# Patient Record
Sex: Female | Born: 1935 | Race: Black or African American | Hispanic: No | Marital: Married
Health system: Southern US, Community
[De-identification: ages and names within clinical notes are randomized; demographics above are authoritative.]

## PROBLEM LIST (undated history)

## (undated) DIAGNOSIS — C50919 Malignant neoplasm of unspecified site of unspecified female breast: Secondary | ICD-10-CM

## (undated) DIAGNOSIS — F0390 Unspecified dementia without behavioral disturbance: Secondary | ICD-10-CM

## (undated) DIAGNOSIS — E119 Type 2 diabetes mellitus without complications: Secondary | ICD-10-CM

---

## 2006-01-11 ENCOUNTER — Ambulatory Visit: Payer: Self-pay | Admitting: Family Medicine

## 2006-05-02 ENCOUNTER — Ambulatory Visit: Payer: Self-pay | Admitting: Otolaryngology

## 2006-06-07 ENCOUNTER — Ambulatory Visit: Payer: Self-pay | Admitting: Otolaryngology

## 2006-06-07 ENCOUNTER — Other Ambulatory Visit: Payer: Self-pay

## 2006-06-15 ENCOUNTER — Ambulatory Visit: Payer: Self-pay | Admitting: Otolaryngology

## 2009-11-13 ENCOUNTER — Ambulatory Visit: Payer: Self-pay | Admitting: Family Medicine

## 2015-05-23 ENCOUNTER — Other Ambulatory Visit: Payer: Self-pay | Admitting: Internal Medicine

## 2015-05-23 DIAGNOSIS — Z1231 Encounter for screening mammogram for malignant neoplasm of breast: Secondary | ICD-10-CM

## 2015-07-01 ENCOUNTER — Ambulatory Visit
Admission: RE | Admit: 2015-07-01 | Discharge: 2015-07-01 | Disposition: A | Discharge status: Home / Self Care | Payer: Medicare Other | Source: Ambulatory Visit | Attending: Internal Medicine | Admitting: Internal Medicine

## 2015-07-01 ENCOUNTER — Ambulatory Visit: Payer: Self-pay

## 2015-07-01 DIAGNOSIS — Z1231 Encounter for screening mammogram for malignant neoplasm of breast: Secondary | ICD-10-CM | POA: Diagnosis present

## 2015-07-01 HISTORY — DX: Malignant neoplasm of unspecified site of unspecified female breast: C50.919

## 2017-10-28 ENCOUNTER — Other Ambulatory Visit: Payer: Self-pay | Admitting: Internal Medicine

## 2017-10-28 DIAGNOSIS — Z780 Asymptomatic menopausal state: Secondary | ICD-10-CM

## 2017-11-17 ENCOUNTER — Ambulatory Visit
Admission: RE | Admit: 2017-11-17 | Discharge: 2017-11-17 | Disposition: A | Discharge status: Home / Self Care | Payer: Medicare Other | Source: Ambulatory Visit | Attending: Internal Medicine | Admitting: Internal Medicine

## 2017-11-17 DIAGNOSIS — Z780 Asymptomatic menopausal state: Secondary | ICD-10-CM

## 2020-01-27 ENCOUNTER — Other Ambulatory Visit: Payer: Self-pay

## 2020-01-27 ENCOUNTER — Emergency Department
Admission: EM | Admit: 2020-01-27 | Discharge: 2020-01-28 | Disposition: A | Discharge status: Home / Self Care | Payer: Medicare PPO | Attending: Emergency Medicine | Admitting: Emergency Medicine

## 2020-01-27 ENCOUNTER — Emergency Department: Payer: Medicare PPO

## 2020-01-27 ENCOUNTER — Encounter: Payer: Self-pay | Admitting: *Deleted

## 2020-01-27 DIAGNOSIS — R5383 Other fatigue: Secondary | ICD-10-CM | POA: Diagnosis present

## 2020-01-27 DIAGNOSIS — R464 Slowness and poor responsiveness: Secondary | ICD-10-CM | POA: Insufficient documentation

## 2020-01-27 DIAGNOSIS — Z853 Personal history of malignant neoplasm of breast: Secondary | ICD-10-CM | POA: Insufficient documentation

## 2020-01-27 DIAGNOSIS — R4182 Altered mental status, unspecified: Secondary | ICD-10-CM | POA: Insufficient documentation

## 2020-01-27 DIAGNOSIS — R4189 Other symptoms and signs involving cognitive functions and awareness: Secondary | ICD-10-CM

## 2020-01-27 DIAGNOSIS — N390 Urinary tract infection, site not specified: Secondary | ICD-10-CM

## 2020-01-27 LAB — CBC
HCT: 36.7 % (ref 36.0–46.0)
Hemoglobin: 11.6 g/dL — ABNORMAL LOW (ref 12.0–15.0)
MCH: 25.2 pg — ABNORMAL LOW (ref 26.0–34.0)
MCHC: 31.6 g/dL (ref 30.0–36.0)
MCV: 79.8 fL — ABNORMAL LOW (ref 80.0–100.0)
Platelets: 294 10*3/uL (ref 150–400)
RBC: 4.6 MIL/uL (ref 3.87–5.11)
RDW: 15 % (ref 11.5–15.5)
WBC: 7.1 10*3/uL (ref 4.0–10.5)
nRBC: 0 % (ref 0.0–0.2)

## 2020-01-27 LAB — BASIC METABOLIC PANEL
Anion gap: 10 (ref 5–15)
BUN: 9 mg/dL (ref 8–23)
CO2: 27 mmol/L (ref 22–32)
Calcium: 9.1 mg/dL (ref 8.9–10.3)
Chloride: 102 mmol/L (ref 98–111)
Creatinine, Ser: 0.82 mg/dL (ref 0.44–1.00)
GFR calc Af Amer: >60 mL/min (ref >60)
GFR calc non Af Amer: >60 mL/min (ref >60)
Glucose, Bld: 218 mg/dL — ABNORMAL HIGH (ref 70–99)
Potassium: 3.6 mmol/L (ref 3.5–5.1)
Sodium: 139 mmol/L (ref 135–145)

## 2020-01-27 LAB — URINALYSIS, COMPLETE (UACMP) WITH MICROSCOPIC
Bilirubin Urine: NEGATIVE
Glucose, UA: NEGATIVE mg/dL
Hgb urine dipstick: NEGATIVE
Ketones, ur: NEGATIVE mg/dL
Nitrite: POSITIVE — AB
Protein, ur: NEGATIVE mg/dL
Specific Gravity, Urine: 1.004 — ABNORMAL LOW (ref 1.005–1.030)
pH: 8 (ref 5.0–8.0)

## 2020-01-27 LAB — TROPONIN I (HIGH SENSITIVITY): Troponin I (High Sensitivity): 5 ng/L (ref <18)

## 2020-01-27 MED ORDER — CEPHALEXIN 500 MG PO CAPS: 500.0000 mg | ORAL_CAPSULE | Freq: Four times a day (QID) | ORAL | 0 refills | Status: AC

## 2020-01-27 MED ORDER — SODIUM CHLORIDE 0.9% FLUSH: 3.0000 mL | Freq: Once | INTRAVENOUS | Status: DC

## 2020-01-27 MED ORDER — SODIUM CHLORIDE 0.9 % IV SOLN
1.0000 g | Freq: Once | INTRAVENOUS | Status: AC
  Administered 2020-01-27: 1 g via INTRAVENOUS
  Filled 2020-01-27: qty 10

## 2020-01-27 NOTE — ED Notes (Signed)
Attempted to I&O cath pt earlier and she became alert (earlier she was restful and not verbal with me) and pt became upset and kicked and swung at nursing staff.  Reoriented pt and placed on purewick.  Gave pt orange juice.  Pt appeared to rest.  Pt then came walking out of her room, steady gait but voiding while walking.  Was able to get pt to come back to room where she voided in hat and then sat on bed and gave pt sandwich meal.  No distress at this time.

## 2020-01-27 NOTE — ED Triage Notes (Signed)
Pt arrives from [facility] by ems for lethargy, being less resposive and CP. At baseline pt is very talkative per ems report. Pt was hypertensive for ems (198/90).

## 2020-01-27 NOTE — ED Provider Notes (Signed)
[facility] Emergency Department Provider Note  ____________________________________________   I have reviewed the triage vital signs and the nursing notes.   HISTORY  Chief Complaint Fatigue   History limited by and level 5 caveat due to: AMS/non verbal   HPI Carol Curtis is a 84 y.o. female who presents to the emergency department today brought by EMS from living facility because of concerns for lethargy and decreased mental status.  Patient herself cannot give any history.  She is altered and essentially nonverbal during my exam.   Records reviewed. Per medical record review patient has a history of breast cancer, dementia.   Past Medical History:  Diagnosis Date  . Breast cancer ([place])    left breast ca    There are no problems to display for this patient.   History reviewed. No pertinent surgical history.  Prior to Admission medications   Not on File    Allergies Patient has no known allergies.  No family history on file.  Social History Social History   Tobacco Use  . Smoking status: Not on file  Substance Use Topics  . Alcohol use: Never  . Drug use: Never    Review of Systems Unable to obtain reliable ROS secondary to AMS ____________________________________________   PHYSICAL EXAM:  VITAL SIGNS: ED Triage Vitals [01/27/20 2027]  Enc Vitals Group     BP (!) 190/74     Pulse Rate 62     Resp 14     Temp 97.6 F (36.4 C)     Temp Source Oral     SpO2 99 %   Constitutional: Awake and alert.  Eyes: Conjunctivae are normal.  ENT      Head: Normocephalic and atraumatic.      Nose: No congestion/rhinnorhea.      Mouth/Throat: Mucous membranes are moist.      Neck: No stridor. Hematological/Lymphatic/Immunilogical: No cervical lymphadenopathy. Cardiovascular: Normal rate, regular rhythm.  No murmurs, rubs, or gallops.  Respiratory: Normal respiratory effort without tachypnea nor retractions. Breath sounds are  clear and equal bilaterally. No wheezes/rales/rhonchi. Gastrointestinal: Soft and non tender. No rebound. No guarding.  Genitourinary: Deferred Musculoskeletal: Normal range of motion in all extremities. No lower extremity edema. Neurologic:  Awake and alert. Non verbal. Appears to be moving all extremities.  Skin:  Skin is warm, dry and intact. No rash noted.  ____________________________________________    LABS (pertinent positives/negatives)  CBC wbc 7.1, hgb 11.6, plt 294 BMP wnl except glu 218  ____________________________________________   EKG  I, Nance Pear, attending physician, personally viewed and interpreted this EKG  EKG Time: 2038 Rate: 63 Rhythm: sinus rhythm Axis: normal Intervals: qtc 425 QRS: narrow, q waves v1 ST changes: no st elevation Impression: abnormal ekg  ____________________________________________    RADIOLOGY  CT head No acute abnormality   ____________________________________________   PROCEDURES  Procedures  ____________________________________________   INITIAL IMPRESSION / ASSESSMENT AND PLAN / ED COURSE  Pertinent labs & imaging results that were available during my care of the patient were reviewed by me and considered in my medical decision making (see chart for details).   Patient presented to the emergency department today because of concerns for decreased responsiveness.  On initial exam patient was essentially nonverbal.  Differential would be broad including intracranial process infection, anemia, electrolyte abnormality amongst other etiologies.  Head CT was negative.  Blood work without concerning anemia or electrolyte abnormality.  While patient was here in the emergency department she did become  much more awake and alert was able to ambulate easily around the room without assistance.  She became more verbal.  She still cannot give good history.  Awaiting urine and troponin at time of  signout.  ____________________________________________   FINAL CLINICAL IMPRESSION(S) / ED DIAGNOSES  Final diagnoses:  Decreased responsiveness     Note: This dictation was prepared with Dragon dictation. Any transcriptional errors that result from this process are unintentional     Nance Pear, MD 01/27/20 2315

## 2020-01-27 NOTE — Discharge Instructions (Signed)
Please seek medical attention for any high fevers, chest pain, shortness of breath, change in behavior, persistent vomiting, bloody stool or any other new or concerning symptoms.  

## 2020-01-27 NOTE — ED Notes (Signed)
Pt is alert, continues to walk out to the room, redirected and given some dessert to occupy her.  No distress.  Pt is eating

## 2020-01-28 LAB — TROPONIN I (HIGH SENSITIVITY): Troponin I (High Sensitivity): 6 ng/L (ref <18)

## 2020-01-28 NOTE — ED Notes (Signed)
Pt was discharged to [facility]

## 2020-01-28 NOTE — ED Notes (Signed)
Pt walked around nurses station with me, no distress.  Helped her back on stretcher and covered her with blankets, lights dimmed so pt can rest.

## 2020-01-28 NOTE — ED Notes (Signed)
Report given to [facility], spoke with [facility].

## 2020-01-28 NOTE — ED Notes (Signed)
Await ambulance transport back

## 2020-01-29 LAB — URINE CULTURE: Culture: >=100000 — AB

## 2020-01-31 NOTE — Progress Notes (Signed)
Brief Pharmacy Note  ED Culture reviewed. Culture with > 100,000 colonies ESBL E.coli. Patient was discharged on Keflex. Discussed with EDP Archie Balboa. Will switch to Macrobid. Call to [facility] to discuss with RN. Information relayed regarding Macrobid and stopping Keflex. New prescription called in to [place] per request of staff at [facility].  Dorena Bodo, PharmD

## 2020-03-20 ENCOUNTER — Emergency Department: Payer: Medicare PPO

## 2020-03-20 ENCOUNTER — Inpatient Hospital Stay
Admission: EM | Admit: 2020-03-20 | Discharge: 2020-04-01 | DRG: 689 | Disposition: A | Discharge status: Skilled Nursing Facility | Payer: Medicare PPO | Source: Skilled Nursing Facility | Attending: Internal Medicine | Admitting: Internal Medicine

## 2020-03-20 ENCOUNTER — Other Ambulatory Visit: Payer: Self-pay

## 2020-03-20 DIAGNOSIS — Z1612 Extended spectrum beta lactamase (ESBL) resistance: Secondary | ICD-10-CM | POA: Diagnosis present

## 2020-03-20 DIAGNOSIS — G9341 Metabolic encephalopathy: Secondary | ICD-10-CM | POA: Diagnosis present

## 2020-03-20 DIAGNOSIS — Z79899 Other long term (current) drug therapy: Secondary | ICD-10-CM | POA: Diagnosis not present

## 2020-03-20 DIAGNOSIS — M461 Sacroiliitis, not elsewhere classified: Secondary | ICD-10-CM | POA: Diagnosis present

## 2020-03-20 DIAGNOSIS — B9629 Other Escherichia coli [E. coli] as the cause of diseases classified elsewhere: Secondary | ICD-10-CM | POA: Diagnosis not present

## 2020-03-20 DIAGNOSIS — R338 Other retention of urine: Secondary | ICD-10-CM | POA: Diagnosis not present

## 2020-03-20 DIAGNOSIS — M7989 Other specified soft tissue disorders: Secondary | ICD-10-CM

## 2020-03-20 DIAGNOSIS — F0391 Unspecified dementia with behavioral disturbance: Secondary | ICD-10-CM | POA: Diagnosis present

## 2020-03-20 DIAGNOSIS — Z95828 Presence of other vascular implants and grafts: Secondary | ICD-10-CM | POA: Diagnosis not present

## 2020-03-20 DIAGNOSIS — Z8619 Personal history of other infectious and parasitic diseases: Secondary | ICD-10-CM | POA: Diagnosis not present

## 2020-03-20 DIAGNOSIS — E1169 Type 2 diabetes mellitus with other specified complication: Secondary | ICD-10-CM | POA: Diagnosis present

## 2020-03-20 DIAGNOSIS — B9620 Unspecified Escherichia coli [E. coli] as the cause of diseases classified elsewhere: Secondary | ICD-10-CM | POA: Diagnosis present

## 2020-03-20 DIAGNOSIS — E1165 Type 2 diabetes mellitus with hyperglycemia: Secondary | ICD-10-CM | POA: Diagnosis present

## 2020-03-20 DIAGNOSIS — R4182 Altered mental status, unspecified: Secondary | ICD-10-CM

## 2020-03-20 DIAGNOSIS — Z1611 Resistance to penicillins: Secondary | ICD-10-CM | POA: Diagnosis present

## 2020-03-20 DIAGNOSIS — Z7189 Other specified counseling: Secondary | ICD-10-CM | POA: Diagnosis not present

## 2020-03-20 DIAGNOSIS — F0280 Dementia in other diseases classified elsewhere without behavioral disturbance: Secondary | ICD-10-CM | POA: Diagnosis not present

## 2020-03-20 DIAGNOSIS — Z794 Long term (current) use of insulin: Secondary | ICD-10-CM

## 2020-03-20 DIAGNOSIS — E785 Hyperlipidemia, unspecified: Secondary | ICD-10-CM | POA: Diagnosis present

## 2020-03-20 DIAGNOSIS — Z853 Personal history of malignant neoplasm of breast: Secondary | ICD-10-CM

## 2020-03-20 DIAGNOSIS — Z7984 Long term (current) use of oral hypoglycemic drugs: Secondary | ICD-10-CM | POA: Diagnosis not present

## 2020-03-20 DIAGNOSIS — G309 Alzheimer's disease, unspecified: Secondary | ICD-10-CM | POA: Diagnosis not present

## 2020-03-20 DIAGNOSIS — W06XXXA Fall from bed, initial encounter: Secondary | ICD-10-CM | POA: Diagnosis present

## 2020-03-20 DIAGNOSIS — R531 Weakness: Secondary | ICD-10-CM | POA: Diagnosis present

## 2020-03-20 DIAGNOSIS — Z20822 Contact with and (suspected) exposure to covid-19: Secondary | ICD-10-CM | POA: Diagnosis present

## 2020-03-20 DIAGNOSIS — Z515 Encounter for palliative care: Secondary | ICD-10-CM | POA: Diagnosis not present

## 2020-03-20 DIAGNOSIS — N3000 Acute cystitis without hematuria: Secondary | ICD-10-CM | POA: Diagnosis present

## 2020-03-20 DIAGNOSIS — K5900 Constipation, unspecified: Secondary | ICD-10-CM

## 2020-03-20 DIAGNOSIS — R52 Pain, unspecified: Secondary | ICD-10-CM

## 2020-03-20 DIAGNOSIS — F0390 Unspecified dementia without behavioral disturbance: Secondary | ICD-10-CM | POA: Diagnosis present

## 2020-03-20 DIAGNOSIS — Z90710 Acquired absence of both cervix and uterus: Secondary | ICD-10-CM | POA: Diagnosis not present

## 2020-03-20 DIAGNOSIS — W19XXXA Unspecified fall, initial encounter: Secondary | ICD-10-CM | POA: Diagnosis present

## 2020-03-20 DIAGNOSIS — N390 Urinary tract infection, site not specified: Secondary | ICD-10-CM

## 2020-03-20 DIAGNOSIS — F05 Delirium due to known physiological condition: Secondary | ICD-10-CM | POA: Diagnosis present

## 2020-03-20 DIAGNOSIS — I82433 Acute embolism and thrombosis of popliteal vein, bilateral: Secondary | ICD-10-CM | POA: Diagnosis present

## 2020-03-20 DIAGNOSIS — Z7982 Long term (current) use of aspirin: Secondary | ICD-10-CM

## 2020-03-20 DIAGNOSIS — I493 Ventricular premature depolarization: Secondary | ICD-10-CM | POA: Diagnosis present

## 2020-03-20 HISTORY — DX: Type 2 diabetes mellitus without complications: E11.9

## 2020-03-20 HISTORY — DX: Unspecified dementia, unspecified severity, without behavioral disturbance, psychotic disturbance, mood disturbance, and anxiety: F03.90

## 2020-03-20 LAB — CBC WITH DIFFERENTIAL/PLATELET
Abs Immature Granulocytes: 0.04 10*3/uL (ref 0.00–0.07)
Basophils Absolute: 0 10*3/uL (ref 0.0–0.1)
Basophils Relative: 0 %
Eosinophils Absolute: 0.1 10*3/uL (ref 0.0–0.5)
Eosinophils Relative: 1 %
HCT: 31.9 % — ABNORMAL LOW (ref 36.0–46.0)
Hemoglobin: 10.8 g/dL — ABNORMAL LOW (ref 12.0–15.0)
Immature Granulocytes: 0 %
Lymphocytes Relative: 10 %
Lymphs Abs: 1.1 10*3/uL (ref 0.7–4.0)
MCH: 25.8 pg — ABNORMAL LOW (ref 26.0–34.0)
MCHC: 33.9 g/dL (ref 30.0–36.0)
MCV: 76.1 fL — ABNORMAL LOW (ref 80.0–100.0)
Monocytes Absolute: 0.9 10*3/uL (ref 0.1–1.0)
Monocytes Relative: 8 %
Neutro Abs: 9 10*3/uL — ABNORMAL HIGH (ref 1.7–7.7)
Neutrophils Relative %: 81 %
Platelets: 267 10*3/uL (ref 150–400)
RBC: 4.19 MIL/uL (ref 3.87–5.11)
RDW: 14.2 % (ref 11.5–15.5)
WBC: 11.1 10*3/uL — ABNORMAL HIGH (ref 4.0–10.5)
nRBC: 0 % (ref 0.0–0.2)

## 2020-03-20 LAB — RESPIRATORY PANEL BY RT PCR (FLU A&B, COVID)
Influenza A by PCR: NEGATIVE
Influenza B by PCR: NEGATIVE
SARS Coronavirus 2 by RT PCR: NEGATIVE

## 2020-03-20 LAB — HEMOGLOBIN A1C
Hgb A1c MFr Bld: 8.8 % — ABNORMAL HIGH (ref 4.8–5.6)
Mean Plasma Glucose: 205.86 mg/dL

## 2020-03-20 LAB — GLUCOSE, CAPILLARY
Glucose-Capillary: 202 mg/dL — ABNORMAL HIGH (ref 70–99)
Glucose-Capillary: 169 mg/dL — ABNORMAL HIGH (ref 70–99)
Glucose-Capillary: 143 mg/dL — ABNORMAL HIGH (ref 70–99)

## 2020-03-20 LAB — BASIC METABOLIC PANEL
Anion gap: 13 (ref 5–15)
BUN: 21 mg/dL (ref 8–23)
CO2: 26 mmol/L (ref 22–32)
Calcium: 9.3 mg/dL (ref 8.9–10.3)
Chloride: 94 mmol/L — ABNORMAL LOW (ref 98–111)
Creatinine, Ser: 1 mg/dL (ref 0.44–1.00)
GFR calc Af Amer: >60 mL/min (ref >60)
GFR calc non Af Amer: 52 mL/min — ABNORMAL LOW (ref >60)
Glucose, Bld: 372 mg/dL — ABNORMAL HIGH (ref 70–99)
Potassium: 4.4 mmol/L (ref 3.5–5.1)
Sodium: 133 mmol/L — ABNORMAL LOW (ref 135–145)

## 2020-03-20 LAB — URINALYSIS, COMPLETE (UACMP) WITH MICROSCOPIC
Bilirubin Urine: NEGATIVE
Glucose, UA: >=500 mg/dL — AB
Hgb urine dipstick: NEGATIVE
Ketones, ur: NEGATIVE mg/dL
Nitrite: NEGATIVE
Protein, ur: 100 mg/dL — AB
Specific Gravity, Urine: 1.026 (ref 1.005–1.030)
pH: 5 (ref 5.0–8.0)

## 2020-03-20 LAB — BETA-HYDROXYBUTYRIC ACID: Beta-Hydroxybutyric Acid: 0.54 mmol/L — ABNORMAL HIGH (ref 0.05–0.27)

## 2020-03-20 MED ORDER — ENOXAPARIN SODIUM 40 MG/0.4ML ~~LOC~~ SOLN
40.0000 mg | SUBCUTANEOUS | Status: DC
  Administered 2020-03-20 – 2020-03-21 (×2): 40 mg via SUBCUTANEOUS
  Filled 2020-03-20 (×2): qty 0.4

## 2020-03-20 MED ORDER — INSULIN ASPART 100 UNIT/ML ~~LOC~~ SOLN
0.0000 [IU] | SUBCUTANEOUS | Status: DC
  Administered 2020-03-20: 3 [IU] via SUBCUTANEOUS
  Administered 2020-03-20: 2 [IU] via SUBCUTANEOUS
  Administered 2020-03-20: 5 [IU] via SUBCUTANEOUS
  Administered 2020-03-21: 8 [IU] via SUBCUTANEOUS
  Administered 2020-03-21 (×3): 2 [IU] via SUBCUTANEOUS
  Administered 2020-03-21: 3 [IU] via SUBCUTANEOUS
  Administered 2020-03-22: 8 [IU] via SUBCUTANEOUS
  Administered 2020-03-22: 3 [IU] via SUBCUTANEOUS
  Administered 2020-03-22: 8 [IU] via SUBCUTANEOUS
  Administered 2020-03-22 – 2020-03-23 (×4): 3 [IU] via SUBCUTANEOUS
  Administered 2020-03-23: 8 [IU] via SUBCUTANEOUS
  Filled 2020-03-20 (×16): qty 1

## 2020-03-20 MED ORDER — SODIUM CHLORIDE 0.9 % IV SOLN: INTRAVENOUS | Status: DC

## 2020-03-20 MED ORDER — SODIUM CHLORIDE 0.9 % IV SOLN
1.0000 g | Freq: Once | INTRAVENOUS | Status: AC
  Administered 2020-03-20: 1 g via INTRAVENOUS
  Filled 2020-03-20: qty 10

## 2020-03-20 MED ORDER — ONDANSETRON HCL 4 MG PO TABS: 4.0000 mg | ORAL_TABLET | Freq: Four times a day (QID) | ORAL | Status: DC | PRN

## 2020-03-20 MED ORDER — INSULIN ASPART 100 UNIT/ML ~~LOC~~ SOLN
6.0000 [IU] | Freq: Once | SUBCUTANEOUS | Status: AC
  Administered 2020-03-20: 6 [IU] via INTRAVENOUS
  Filled 2020-03-20: qty 1

## 2020-03-20 MED ORDER — SODIUM CHLORIDE 0.9 % IV BOLUS
500.0000 mL | Freq: Once | INTRAVENOUS | Status: AC
  Administered 2020-03-20: 500 mL via INTRAVENOUS

## 2020-03-20 MED ORDER — ONDANSETRON HCL 4 MG/2ML IJ SOLN: 4.0000 mg | Freq: Four times a day (QID) | INTRAMUSCULAR | Status: DC | PRN

## 2020-03-20 MED ORDER — SODIUM CHLORIDE 0.9 % IV SOLN
1.0000 g | Freq: Two times a day (BID) | INTRAVENOUS | Status: DC
  Administered 2020-03-20 – 2020-03-21 (×3): 1 g via INTRAVENOUS
  Filled 2020-03-20 (×5): qty 1

## 2020-03-20 NOTE — Consult Note (Signed)
Pharmacy Antibiotic Note  Carol Curtis is a 84 y.o. female admitted on 03/20/2020 with UTI.  Pharmacy has been consulted for Meropenem dosing. Patient has a history of E.Coli (ESBL) urinary infection. PMH significant for dementia and diabetes mellitus. Patient had an unwitnessed fall and blood glucose on presentatio was 411. Patient was noted to be confused and weak, per notes.  Plan: Will order Meropenem 1g Q12H.   Height: 5\' 4"  (162.6 cm) Weight: 72.6 kg (160 lb) IBW/kg (Calculated) : 54.7  Temp (24hrs), Avg:98.7 F (37.1 C), Min:98.7 F (37.1 C), Max:98.7 F (37.1 C)  Recent Labs  Lab 03/20/20 0633  WBC 11.1*  CREATININE 1.00    Estimated Creatinine Clearance: 42.4 mL/min (by C-G formula based on SCr of 1 mg/dL).    No Known Allergies  Antimicrobials this admission: 9/30 Meropenem >>  Dose adjustments this admission:   Microbiology results: BCx:  UCx:  Sputum:  MRSA PCR:   Thank you for allowing pharmacy to be a part of this patient's care.  Rowland Lathe 03/20/2020 12:57 PM

## 2020-03-20 NOTE — ED Provider Notes (Signed)
[facility] Emergency Department Provider Note    First MD Initiated Contact with Patient 03/20/20 0700     (approximate)  I have reviewed the triage vital signs and the nursing notes.   HISTORY  Chief Complaint Fall  Level V Caveat:  AMS - dementia  HPI Carol Curtis is a 84 y.o. female the below listed past medical history presents to the ER altered mental status after fall out of bed.  Patient not complaining of any pain.  Very poor historian.  No further details provided.  Noted to be hyperglycemic on arrival.    Past Medical History:  Diagnosis Date  . Breast cancer ([place])    left breast ca  . Dementia ([place])   . Diabetes mellitus without complication ([place])    History reviewed. No pertinent family history. History reviewed. No pertinent surgical history. Patient Active Problem List   Diagnosis Date Noted  . Acute metabolic encephalopathy 31/51/7616  . Dementia [facility])       Prior to Admission medications   Not on File    Allergies Patient has no known allergies.    Social History Social History   Tobacco Use  . Smoking status: Unknown If Ever Smoked  Substance Use Topics  . Alcohol use: Never  . Drug use: Never    Review of Systems Patient denies headaches, rhinorrhea, blurry vision, numbness, shortness of breath, chest pain, edema, cough, abdominal pain, nausea, vomiting, diarrhea, dysuria, fevers, rashes or hallucinations unless otherwise stated above in HPI. ____________________________________________   PHYSICAL EXAM:  VITAL SIGNS: Vitals:   03/20/20 0700 03/20/20 0837  BP: 122/63 130/68  Pulse: 94   Resp: (!) 21 17  Temp:    SpO2: 97%     Constitutional: Alert and disoriented.  Eyes: Conjunctivae are normal.  Head: Atraumatic. Nose: No congestion/rhinnorhea. Mouth/Throat: Mucous membranes are moist.   Neck: No stridor. Painless ROM.  Cardiovascular: Normal rate, regular rhythm. Grossly normal heart  sounds.  Good peripheral circulation. Respiratory: Normal respiratory effort.  No retractions. Lungs CTAB. Gastrointestinal: Soft and nontender. No distention. No abdominal bruits. No CVA tenderness. Genitourinary:  Musculoskeletal: pain of right hip with log roll, BLE 2+ edema.  No joint effusions. Neurologic:   No gross focal neurologic deficits are appreciated. No facial droop Skin:  Skin is warm, dry and intact. No rash noted. Psychiatric: calm and cooperative ____________________________________________   LABS (all labs ordered are listed, but only abnormal results are displayed)  Results for orders placed or performed during the hospital encounter of 03/20/20 (from the past 24 hour(s))  Basic metabolic panel     Status: Abnormal   Collection Time: 03/20/20  6:33 AM  Result Value Ref Range   Sodium 133 (L) 135 - 145 mmol/L   Potassium 4.4 3.5 - 5.1 mmol/L   Chloride 94 (L) 98 - 111 mmol/L   CO2 26 22 - 32 mmol/L   Glucose, Bld 372 (H) 70 - 99 mg/dL   BUN 21 8 - 23 mg/dL   Creatinine, Ser 1.00 0.44 - 1.00 mg/dL   Calcium 9.3 8.9 - 10.3 mg/dL   GFR calc non Af Amer 52 (L) >60 mL/min   GFR calc Af Amer >60 >60 mL/min   Anion gap 13 5 - 15  CBC with Differential/Platelet     Status: Abnormal   Collection Time: 03/20/20  6:33 AM  Result Value Ref Range   WBC 11.1 (H) 4.0 - 10.5 K/uL   RBC 4.19 3.87 -  5.11 MIL/uL   Hemoglobin 10.8 (L) 12.0 - 15.0 g/dL   HCT 31.9 (L) 36 - 46 %   MCV 76.1 (L) 80.0 - 100.0 fL   MCH 25.8 (L) 26.0 - 34.0 pg   MCHC 33.9 30.0 - 36.0 g/dL   RDW 14.2 11.5 - 15.5 %   Platelets 267 150 - 400 K/uL   nRBC 0.0 0.0 - 0.2 %   Neutrophils Relative % 81 %   Neutro Abs 9.0 (H) 1.7 - 7.7 K/uL   Lymphocytes Relative 10 %   Lymphs Abs 1.1 0.7 - 4.0 K/uL   Monocytes Relative 8 %   Monocytes Absolute 0.9 0 - 1 K/uL   Eosinophils Relative 1 %   Eosinophils Absolute 0.1 0 - 0 K/uL   Basophils Relative 0 %   Basophils Absolute 0.0 0 - 0 K/uL   Immature  Granulocytes 0 %   Abs Immature Granulocytes 0.04 0.00 - 0.07 K/uL  Beta-hydroxybutyric acid     Status: Abnormal   Collection Time: 03/20/20  6:33 AM  Result Value Ref Range   Beta-Hydroxybutyric Acid 0.54 (H) 0.05 - 0.27 mmol/L  Urinalysis, Complete w Microscopic Urine, Clean Catch     Status: Abnormal   Collection Time: 03/20/20  8:09 AM  Result Value Ref Range   Color, Urine AMBER (A) YELLOW   APPearance CLOUDY (A) CLEAR   Specific Gravity, Urine 1.026 1.005 - 1.030   pH 5.0 5.0 - 8.0   Glucose, UA >=500 (A) NEGATIVE mg/dL   Hgb urine dipstick NEGATIVE NEGATIVE   Bilirubin Urine NEGATIVE NEGATIVE   Ketones, ur NEGATIVE NEGATIVE mg/dL   Protein, ur 100 (A) NEGATIVE mg/dL   Nitrite NEGATIVE NEGATIVE   Leukocytes,Ua TRACE (A) NEGATIVE   RBC / HPF 0-5 0 - 5 RBC/hpf   WBC, UA 11-20 0 - 5 WBC/hpf   Bacteria, UA MANY (A) NONE SEEN   Squamous Epithelial / LPF 0-5 0 - 5   Mucus PRESENT    Hyaline Casts, UA PRESENT   Respiratory Panel by RT PCR (Flu A&B, Covid) - Nasopharyngeal Swab     Status: None   Collection Time: 03/20/20 10:21 AM   Specimen: Nasopharyngeal Swab  Result Value Ref Range   SARS Coronavirus 2 by RT PCR NEGATIVE NEGATIVE   Influenza A by PCR NEGATIVE NEGATIVE   Influenza B by PCR NEGATIVE NEGATIVE   ____________________________________________  EKG My review and personal interpretation at Time: 8:03   Indication: fall  Rate: 90  Rhythm: sinus Axis: normal Other: nonspecific st abn, no stemi ____________________________________________  RADIOLOGY  I personally reviewed all radiographic images ordered to evaluate for the above acute complaints and reviewed radiology reports and findings.  These findings were personally discussed with the patient.  Please see medical record for radiology report.  ____________________________________________   PROCEDURES  Procedure(s) performed:  Procedures    Critical Care performed:  no ____________________________________________   INITIAL IMPRESSION / ASSESSMENT AND PLAN / ED COURSE  Pertinent labs & imaging results that were available during my care of the patient were reviewed by me and considered in my medical decision making (see chart for details).   DDX: fracture, contusion, dehydration, ich, dislocation, anemia, dka, hhs  Carol Curtis is a 84 y.o. who presents to the ED with presentation as described above.  Patient able to provide much additional history.  Blood work and imaging will be sent for the but differential.  The patient will be placed on continuous pulse oximetry  and telemetry for monitoring.  Laboratory evaluation will be sent to evaluate for the above complaints.     Clinical Course as of Mar 20 1117  Thu Mar 20, 2020  0932 No sign of fracture on imaging or acute intracranial abnormality.  No fever but does have many bacteria mild leukocytosis.  Will give dose of Rocephin.  Will ambulate to see if she is stable for discharge home will require hospitalization   [PR]  1015 I called [place] to get collateral apparently patient is typically able to ambulate with out assistive device.  Today she is more confused than usual she is unable to ambulate with even two-person assistance.  Does have component of encephalopathy therefore given her evidence of cystitis will discussed with hospitalist for observation for IV fluids IV antibiotics and reassessment.   [PR]    Clinical Course User Index [PR] Merlyn Lot, MD    The patient was evaluated in Emergency Department today for the symptoms described in the history of present illness. He/she was evaluated in the context of the global COVID-19 pandemic, which necessitated consideration that the patient might be at risk for infection with the SARS-CoV-2 virus that causes COVID-19. Institutional protocols and algorithms that pertain to the evaluation of patients at risk for COVID-19 are in a state  of rapid change based on information released by regulatory bodies including the CDC and federal and state organizations. These policies and algorithms were followed during the patient's care in the ED.  As part of my medical decision making, I reviewed the following data within the [place] notes reviewed and incorporated, Labs reviewed, notes from prior ED visits and [place] Controlled Substance Database   ____________________________________________   FINAL CLINICAL IMPRESSION(S) / ED DIAGNOSES  Final diagnoses:  Fall, initial encounter  Altered mental status, unspecified altered mental status type  Weakness  Acute cystitis without hematuria      NEW MEDICATIONS STARTED DURING THIS VISIT:  New Prescriptions   No medications on file     Note:  This document was prepared using Dragon voice recognition software and may include unintentional dictation errors.    Merlyn Lot, MD 03/20/20 1118

## 2020-03-20 NOTE — ED Triage Notes (Signed)
Pr from [place] via [place]. C/o unwitnessed fall, pt states she "rolled out of bed" and doesn't know what time it happened. EMS reports pt has not been checked until 0512, with unknown amount of time in between. Pt c/o abdominal pain, denies LOC, denies hitting head. EMS cleared neck and spine, no obvious trauma noted.   Pt has hx of dementia, DM. EMS CBG: 411.

## 2020-03-20 NOTE — ED Notes (Signed)
Pt won't currently answer this RN's questions but stated "okay" when this RN explained she was giving 5 units of insulin for pt's BG 202.

## 2020-03-20 NOTE — H&P (Signed)
History and Physical    Carol Curtis VQQ:[id number] DOB: [birth year] DOA: 03/20/2020  PCP: Orvis Brill, [facility]   Patient coming from: Skilled nursing facility  I have personally briefly reviewed patient's old medical records in [place]  Chief Complaint: Fall  Most of the history is obtained from ER notes as patient is unable to provide any history  HPI: Carol Curtis is a 84 y.o. female with medical history significant for dementia, diabetes mellitus, history of left breast cancer who was brought to the emergency room via EMS after an unwitnessed fall.  Patient was found on the floor about 5:12 a.m and said she had rolled out of bed.  She denied hitting her head.  EMS clear neck and spine and no obvious trauma was noted.  Her blood sugar in the field was 411. Per nursing home staff patient is more confused when compared to her baseline and is usually able to ambulate without an assist device.  In the ER she was noted to be weak and was unable to ambulate with a two-person assist. I am unable to do a review of systems on this patient due to her mental status changes. Labs show sodium of 133, potassium 4.4, chloride 94, bicarb 26, glucose 272, BUN 21, creatinine 1, calcium 9.3, WBC 11.1 with a left shift, hemoglobin 10.8 hematocrit 31.9 MCV 76, RDW 14.7, platelet count 267 CT scan of the head without contrast showed chronic atrophic and ischemic changes without acute abnormality CT scan of cervical spine showed multilevel degenerative change without acute abnormality CT scan of the right hip showed no acute abnormality.  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 Chest x-ray reviewed by me showed no acute findings Twelve-lead EKG reviewed by me shows sinus rhythm with PVCs    ED Course: Patient is an 84 year old female who resides in a skilled nursing facility who was brought to the ER for evaluation following a fall.  Patient is very weak and unable to  ambulate which is different from her baseline. She also has pyuria with worsening confusion and will be admitted to the hospital for further evaluation.  Review of Systems: As per HPI otherwise 10 point review of systems negative.    Past Medical History:  Diagnosis Date  . Breast cancer ([place])    left breast ca  . Dementia ([place])   . Diabetes mellitus without complication ([place])     History reviewed. No pertinent surgical history.   reports that she does not drink alcohol and does not use drugs. No history on file for tobacco use.  No Known Allergies  History reviewed. No pertinent family history.   Prior to Admission medications   Medication Sig Start Date End Date Taking? Authorizing Provider  amLODipine (NORVASC) 5 MG tablet Take 5 mg by mouth daily.   Yes [provider]  aspirin 81 MG chewable tablet Chew 81 mg by mouth daily.   Yes [provider]  atorvastatin (LIPITOR) 20 MG tablet Take 20 mg by mouth daily.   Yes [provider]  B Complex-C (B-COMPLEX WITH VITAMIN C) tablet Take 1 tablet by mouth daily.   Yes [provider]  Calcium-Magnesium-Zinc 334-134-5 MG TABS Take 1 tablet by mouth every evening.   Yes [provider]  donepezil (ARICEPT) 10 MG tablet Take 10 mg by mouth at bedtime.   Yes [provider]  folic acid (FOLVITE) 1 MG tablet Take 1 mg by mouth daily.   Yes  [provider]  furosemide (LASIX) 20 MG tablet Take 20 mg by mouth.   Yes [provider]  insulin lispro protamine-lispro (HUMALOG 75/25 MIX) (75-25) 100 UNIT/ML SUSP injection Inject 10-15 Units into the skin 2 (two) times daily with a meal.   Yes [provider]  metFORMIN (GLUCOPHAGE) 1000 MG tablet Take 1,000 mg by mouth 2 (two) times daily with a meal.   Yes [provider]  Multiple Vitamins-Minerals (CENTRUM SILVER 50+WOMEN) TABS Take 1 tablet by mouth daily.   Yes [provider]  Multiple  Vitamins-Minerals (CERTAGEN PO) Take 1 tablet by mouth daily.   Yes [provider]  pyridOXINE (VITAMIN B-6) 50 MG tablet Take 50 mg by mouth daily.   Yes [provider]  traZODone (DESYREL) 50 MG tablet Take 50 mg by mouth at bedtime.   Yes [provider]  furosemide (LASIX) 40 MG tablet Take 40 mg by mouth.    [provider]    Physical Exam: Vitals:   03/20/20 0700 03/20/20 0837 03/20/20 1145 03/20/20 1200  BP: 122/63 130/68  135/67  Pulse: 94  85   Resp: (!) 21 17 17 15   Temp:      TempSrc:      SpO2: 97%  99%   Weight:      Height:         Vitals:   03/20/20 0700 03/20/20 0837 03/20/20 1145 03/20/20 1200  BP: 122/63 130/68  135/67  Pulse: 94  85   Resp: (!) 21 17 17 15   Temp:      TempSrc:      SpO2: 97%  99%   Weight:      Height:        Constitutional: Lethargic, withdraws from painful stimuli Eyes: PERRL, lids and conjunctivae normal ENMT: Mucous membranes are moist.  Neck: normal, supple, no masses, no thyromegaly Respiratory: clear to auscultation bilaterally, no wheezing, no crackles. Normal respiratory effort. No accessory muscle use.  Cardiovascular: Regular rate and rhythm, no murmurs / rubs / gallops. No extremity edema. 2+ pedal pulses. No carotid bruits.  Abdomen: no tenderness, no masses palpated. No hepatosplenomegaly. Bowel sounds positive.  Musculoskeletal: no clubbing / cyanosis. No joint deformity upper and lower extremities.  Skin: no rashes, lesions, ulcers.  Neurologic: No gross focal neurologic deficit.  Unable to assess Psychiatric: Normal mood and affect.   Labs on Admission: I have personally reviewed following labs and imaging studies  CBC: Recent Labs  Lab 03/20/20 0633  WBC 11.1*  NEUTROABS 9.0*  HGB 10.8*  HCT 31.9*  MCV 76.1*  PLT 962   Basic Metabolic Panel: Recent Labs  Lab 03/20/20 0633  NA 133*  K 4.4  CL 94*  CO2 26  GLUCOSE 372*  BUN 21  CREATININE 1.00  CALCIUM 9.3    GFR: Estimated Creatinine Clearance: 42.4 mL/min (by C-G formula based on SCr of 1 mg/dL). Liver Function Tests: No results for input(s): AST, ALT, ALKPHOS, BILITOT, PROT, ALBUMIN in the last 168 hours. No results for input(s): LIPASE, AMYLASE in the last 168 hours. No results for input(s): AMMONIA in the last 168 hours. Coagulation Profile: No results for input(s): INR, PROTIME in the last 168 hours. Cardiac Enzymes: No results for input(s): CKTOTAL, CKMB, CKMBINDEX, TROPONINI in the last 168 hours. BNP (last 3 results) No results for input(s): PROBNP in the last 8760 hours. HbA1C: No results for input(s): HGBA1C in the last 72 hours. CBG: No results for input(s): GLUCAP in  the last 168 hours. Lipid Profile: No results for input(s): CHOL, HDL, LDLCALC, TRIG, CHOLHDL, LDLDIRECT in the last 72 hours. Thyroid Function Tests: No results for input(s): TSH, T4TOTAL, FREET4, T3FREE, THYROIDAB in the last 72 hours. Anemia Panel: No results for input(s): VITAMINB12, FOLATE, FERRITIN, TIBC, IRON, RETICCTPCT in the last 72 hours. Urine analysis:    Component Value Date/Time   COLORURINE AMBER (A) 03/20/2020 0809   APPEARANCEUR CLOUDY (A) 03/20/2020 0809   LABSPEC 1.026 03/20/2020 0809   PHURINE 5.0 03/20/2020 0809   GLUCOSEU >=500 (A) 03/20/2020 0809   HGBUR NEGATIVE 03/20/2020 0809   BILIRUBINUR NEGATIVE 03/20/2020 0809   KETONESUR NEGATIVE 03/20/2020 0809   PROTEINUR 100 (A) 03/20/2020 0809   NITRITE NEGATIVE 03/20/2020 0809   LEUKOCYTESUR TRACE (A) 03/20/2020 0809    Radiological Exams on Admission: CT Head Wo Contrast  Result Date: 03/20/2020 CLINICAL DATA:  Recent fall with headaches and neck pain, initial encounter EXAM: CT HEAD WITHOUT CONTRAST CT CERVICAL SPINE WITHOUT CONTRAST TECHNIQUE: Multidetector CT imaging of the head and cervical spine was performed following the standard protocol without intravenous contrast. Multiplanar CT image reconstructions of the cervical  spine were also generated. COMPARISON:  01/27/2020 FINDINGS: CT HEAD FINDINGS Brain: Chronic atrophic and white matter ischemic changes are again identified and stable. No findings to suggest acute hemorrhage, acute infarction or space-occupying mass lesion are seen. Vascular: No hyperdense vessel or unexpected calcification. Skull: Normal. Negative for fracture or focal lesion. Sinuses/Orbits: No acute finding. Other: None. CT CERVICAL SPINE FINDINGS Alignment: Within normal limits. Skull base and vertebrae: 7 cervical segments are well visualized. Mild motion artifact limits the evaluation somewhat. Mild osteophytic changes and facet hypertrophic changes are seen. No acute fracture or acute facet abnormality is noted. The odontoid is within normal limits. Soft tissues and spinal canal: Surrounding soft tissue structures demonstrate vascular calcifications. No focal mass lesion or adenopathy is noted. Upper chest: Visualized lung apices are within normal limits. Other: None IMPRESSION: CT of the head: Chronic atrophic and ischemic changes without acute abnormality. CT of the cervical spine: Multilevel degenerative change without acute abnormality. Electronically Signed   By: Inez Catalina M.D.   On: 03/20/2020 08:07   CT Cervical Spine Wo Contrast  Result Date: 03/20/2020 CLINICAL DATA:  Recent fall with headaches and neck pain, initial encounter EXAM: CT HEAD WITHOUT CONTRAST CT CERVICAL SPINE WITHOUT CONTRAST TECHNIQUE: Multidetector CT imaging of the head and cervical spine was performed following the standard protocol without intravenous contrast. Multiplanar CT image reconstructions of the cervical spine were also generated. COMPARISON:  01/27/2020 FINDINGS: CT HEAD FINDINGS Brain: Chronic atrophic and white matter ischemic changes are again identified and stable. No findings to suggest acute hemorrhage, acute infarction or space-occupying mass lesion are seen. Vascular: No hyperdense vessel or unexpected  calcification. Skull: Normal. Negative for fracture or focal lesion. Sinuses/Orbits: No acute finding. Other: None. CT CERVICAL SPINE FINDINGS Alignment: Within normal limits. Skull base and vertebrae: 7 cervical segments are well visualized. Mild motion artifact limits the evaluation somewhat. Mild osteophytic changes and facet hypertrophic changes are seen. No acute fracture or acute facet abnormality is noted. The odontoid is within normal limits. Soft tissues and spinal canal: Surrounding soft tissue structures demonstrate vascular calcifications. No focal mass lesion or adenopathy is noted. Upper chest: Visualized lung apices are within normal limits. Other: None IMPRESSION: CT of the head: Chronic atrophic and ischemic changes without acute abnormality. CT of the cervical spine: Multilevel degenerative change without acute abnormality. Electronically Signed  By: Inez Catalina M.D.   On: 03/20/2020 08:07   CT Hip Right Wo Contrast  Result Date: 03/20/2020 CLINICAL DATA:  Right hip pain after a fall out of bed last night. Initial encounter. EXAM: CT OF THE RIGHT HIP WITHOUT CONTRAST TECHNIQUE: Multidetector CT imaging of the right hip was performed according to the standard protocol. Multiplanar CT image reconstructions were also generated. COMPARISON:  Plain films right hip earlier today. FINDINGS: Bones/Joint/Cartilage The right hip is located. There is no fracture. Minimal right hip osteoarthritis is present. There is moderate degenerative disease at the visualized right SI joint. No lytic or sclerotic lesion. No hip joint effusion. Ligaments Suboptimally assessed by CT. Muscles and Tendons Appear in tact. No muscle atrophy. No intramuscular hematoma or other fluid collection. Soft tissues Atherosclerosis is noted. IMPRESSION: No acute abnormality. 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 Electronically Signed   By: Inge Rise M.D.   On: 03/20/2020 09:25   DG Chest Portable 1  View  Result Date: 03/20/2020 CLINICAL DATA:  Un witnessed fall with chest pain, initial encounter EXAM: PORTABLE CHEST 1 VIEW COMPARISON:  None. FINDINGS: Cardiac shadow is within normal limits. Aortic calcifications are seen. The lungs are clear. Postsurgical changes in the left axilla are noted. No acute bony abnormality is noted. IMPRESSION: No active disease. Electronically Signed   By: Inez Catalina M.D.   On: 03/20/2020 08:13   DG Hip Unilat W or Wo Pelvis 2-3 Views Right  Result Date: 03/20/2020 CLINICAL DATA:  Right hip pain after fall EXAM: DG HIP (WITH OR WITHOUT PELVIS) 2-3V RIGHT COMPARISON:  None. FINDINGS: Subtle cortical irregularity along the lateral margin of the right femoral neck suspicious for a nondisplaced fracture. Osseous structures appear demineralized. Normal alignment without dislocation. Vascular calcifications. Partially visualized IVC filter. IMPRESSION: Findings raise suspicion for a nondisplaced fracture of the right femoral neck. Further evaluation with MRI or CT can be performed to further evaluate. Electronically Signed   By: Davina Poke D.O.   On: 03/20/2020 08:17    EKG: Independently reviewed.  Sinus rhythm PVCs  Assessment/Plan Principal Problem:   Acute metabolic encephalopathy Active Problems:   Dementia ([facility])   UTI (urinary tract infection)   Fall   Generalized weakness   Diabetes mellitus ([place])      Acute metabolic encephalopathy Patient is lethargic but responds to painful stimuli Most likely secondary to UTI Expect improvement in patient's mental status following resolution of acute infectious process   UTI Patient with significant pyuria She has a white count of 11,000 Prior urine culture yielded ESBL E. Coli We will treat patient with meropenem until urine culture results become available     Generalized weakness Status post fall Place patient on fall precautions Physical therapy evaluation    Dementia Hold trazodone  and donepezil until patient is more awake and alert   Diabetes mellitus Patient is n.p.o. until her mental status improves Glycemic control with Accu-Cheks every 4 hours  DVT prophylaxis: Lovenox Code Status: Full code Family Communication: Greater than 50% of time was spent discussing patient's condition and plan of care with her healthcare power of attorney Georga Kaufmann.  All questions and concerns have been addressed.  CODE STATUS was discussed and patient is a full code Disposition Plan: Back to previous home environment Consults called: Physical Therapy    Vedant Shehadeh MD [facility]     03/20/2020, 12:42 PM

## 2020-03-20 NOTE — ED Notes (Signed)
Marylene Buerger POA updated as requested.

## 2020-03-20 NOTE — ED Notes (Signed)
Report to Georgie, RN.

## 2020-03-21 DIAGNOSIS — E1165 Type 2 diabetes mellitus with hyperglycemia: Secondary | ICD-10-CM | POA: Diagnosis not present

## 2020-03-21 DIAGNOSIS — N3000 Acute cystitis without hematuria: Secondary | ICD-10-CM

## 2020-03-21 DIAGNOSIS — G9341 Metabolic encephalopathy: Secondary | ICD-10-CM | POA: Diagnosis not present

## 2020-03-21 DIAGNOSIS — Z794 Long term (current) use of insulin: Secondary | ICD-10-CM

## 2020-03-21 DIAGNOSIS — G309 Alzheimer's disease, unspecified: Secondary | ICD-10-CM | POA: Diagnosis not present

## 2020-03-21 DIAGNOSIS — R531 Weakness: Secondary | ICD-10-CM

## 2020-03-21 LAB — BASIC METABOLIC PANEL
Anion gap: 9 (ref 5–15)
BUN: 10 mg/dL (ref 8–23)
CO2: 25 mmol/L (ref 22–32)
Calcium: 8.8 mg/dL — ABNORMAL LOW (ref 8.9–10.3)
Chloride: 102 mmol/L (ref 98–111)
Creatinine, Ser: 0.75 mg/dL (ref 0.44–1.00)
GFR calc Af Amer: >60 mL/min (ref >60)
GFR calc non Af Amer: >60 mL/min (ref >60)
Glucose, Bld: 141 mg/dL — ABNORMAL HIGH (ref 70–99)
Potassium: 3.9 mmol/L (ref 3.5–5.1)
Sodium: 136 mmol/L (ref 135–145)

## 2020-03-21 LAB — CBC
HCT: 32.1 % — ABNORMAL LOW (ref 36.0–46.0)
Hemoglobin: 10.5 g/dL — ABNORMAL LOW (ref 12.0–15.0)
MCH: 25.6 pg — ABNORMAL LOW (ref 26.0–34.0)
MCHC: 32.7 g/dL (ref 30.0–36.0)
MCV: 78.3 fL — ABNORMAL LOW (ref 80.0–100.0)
Platelets: 220 10*3/uL (ref 150–400)
RBC: 4.1 MIL/uL (ref 3.87–5.11)
RDW: 14.3 % (ref 11.5–15.5)
WBC: 9.7 10*3/uL (ref 4.0–10.5)
nRBC: 0 % (ref 0.0–0.2)

## 2020-03-21 LAB — GLUCOSE, CAPILLARY
Glucose-Capillary: 142 mg/dL — ABNORMAL HIGH (ref 70–99)
Glucose-Capillary: 148 mg/dL — ABNORMAL HIGH (ref 70–99)
Glucose-Capillary: 149 mg/dL — ABNORMAL HIGH (ref 70–99)
Glucose-Capillary: 175 mg/dL — ABNORMAL HIGH (ref 70–99)
Glucose-Capillary: 190 mg/dL — ABNORMAL HIGH (ref 70–99)
Glucose-Capillary: 252 mg/dL — ABNORMAL HIGH (ref 70–99)
Glucose-Capillary: 81 mg/dL (ref 70–99)

## 2020-03-21 MED ORDER — VITAMIN B-6 50 MG PO TABS
50.0000 mg | ORAL_TABLET | Freq: Every day | ORAL | Status: DC
  Administered 2020-03-22 – 2020-04-01 (×9): 50 mg via ORAL
  Filled 2020-03-21 (×13): qty 1

## 2020-03-21 MED ORDER — FOLIC ACID 1 MG PO TABS
1.0000 mg | ORAL_TABLET | Freq: Every day | ORAL | Status: DC
  Administered 2020-03-22 – 2020-04-01 (×9): 1 mg via ORAL
  Filled 2020-03-21 (×10): qty 1

## 2020-03-21 MED ORDER — AMLODIPINE BESYLATE 5 MG PO TABS: 5.0000 mg | ORAL_TABLET | Freq: Every day | ORAL | Status: DC

## 2020-03-21 MED ORDER — ASPIRIN 81 MG PO CHEW
81.0000 mg | CHEWABLE_TABLET | Freq: Every day | ORAL | Status: DC
  Administered 2020-03-22: 81 mg via ORAL
  Filled 2020-03-21: qty 1

## 2020-03-21 MED ORDER — TRAZODONE HCL 50 MG PO TABS
50.0000 mg | ORAL_TABLET | Freq: Every day | ORAL | Status: DC
  Administered 2020-03-21 – 2020-03-30 (×8): 50 mg via ORAL
  Filled 2020-03-21 (×11): qty 1

## 2020-03-21 MED ORDER — PIPERACILLIN-TAZOBACTAM 3.375 G IVPB
3.3750 g | Freq: Three times a day (TID) | INTRAVENOUS | Status: DC
  Administered 2020-03-22 – 2020-03-23 (×5): 3.375 g via INTRAVENOUS
  Filled 2020-03-21 (×4): qty 50

## 2020-03-21 MED ORDER — ADULT MULTIVITAMIN W/MINERALS CH
1.0000 | ORAL_TABLET | Freq: Every day | ORAL | Status: DC
  Administered 2020-03-22 – 2020-04-01 (×9): 1 via ORAL
  Filled 2020-03-21 (×9): qty 1

## 2020-03-21 MED ORDER — DONEPEZIL HCL 5 MG PO TABS
10.0000 mg | ORAL_TABLET | Freq: Every day | ORAL | Status: DC
  Administered 2020-03-21 – 2020-03-30 (×8): 10 mg via ORAL
  Filled 2020-03-21 (×12): qty 2

## 2020-03-21 NOTE — Progress Notes (Signed)
°   03/21/20 2122  Unsuccessful Nursing Procedure/Treatment  Type of Nursing Procedure/Treatment Peripheral IV insertion  Number of attempts 1  Location of attempt RFA  IV team to be consulted

## 2020-03-21 NOTE — Consult Note (Signed)
Pharmacy Antibiotic Note  Carol Curtis is a 84 y.o. female admitted on 03/20/2020 with UTI.  Pharmacy has been consulted for piperacillin/tazobactam dosing. Patient has a history of E.Coli (ESBL) urinary infection. PMH significant for dementia and diabetes mellitus. Patient had an unwitnessed fall and blood glucose on presentatio was 411. Patient was noted to be confused and weak, per notes.  8/10 Ucx ESBL E. Coli fully susceptible to pip/tazo  Plan: Piperacillin/tazobactam 3.375gm IV q8h over 4h infusion (to spare carbapenem therapy non-severe infection) - f/u urine culture (added to UA - this will make urine culture less reliable and more likely to grow non-pathogenic bacteria)  Height: 5\' 4"  (162.6 cm) Weight: 72.6 kg (160 lb) IBW/kg (Calculated) : 54.7  Temp (24hrs), Avg:98 F (36.7 C), Min:97.6 F (36.4 C), Max:98.6 F (37 C)  Recent Labs  Lab 03/20/20 0633 03/21/20 0527  WBC 11.1* 9.7  CREATININE 1.00 0.75    Estimated Creatinine Clearance: 53 mL/min (by C-G formula based on SCr of 0.75 mg/dL).    No Known Allergies  Antimicrobials this admission: 9/30 mero >> 10/1 10/1 pip/tazo  Dose adjustments this admission:   Microbiology results: 9/30 Ucx  Thank you for allowing pharmacy to be a part of this patient's care.  Doreene Eland, PharmD, BCPS.   Work Cell: [phone] 03/21/2020 1:57 PM

## 2020-03-21 NOTE — Progress Notes (Signed)
Patient ID: Carol Curtis, female   DOB: 07-27-1935, 84 y.o.   MRN: [id number] Carol Curtis  Carol Curtis OBS:[id number] DOB: [birth year] DOA: 03/20/2020 PCP: [facility]  HPI/Subjective: Patient awakened from sleep this morning and answered a few yes or no questions and went back to sleep.  Patient's daughter at the bedside this afternoon says she is doing better than she was yesterday.  Objective: Vitals:   03/21/20 0431 03/21/20 0746  BP: 140/75 132/71  Pulse: 81 85  Resp: 14 18  Temp: 98.2 F (36.8 C) 97.6 F (36.4 C)  SpO2: 100% 94%    Intake/Output Summary (Last 24 hours) at 03/21/2020 1423 Last data filed at 03/21/2020 0300 Gross per 24 hour  Intake 1047.88 ml  Output --  Net 1047.88 ml   Filed Weights   03/20/20 0605  Weight: 72.6 kg    ROS: Review of Systems  Unable to perform ROS: Acuity of condition  Respiratory: Negative for shortness of breath.   Cardiovascular: Negative for chest pain.  Gastrointestinal: Negative for abdominal pain.   Exam: Physical Exam HENT:     Head: Normocephalic.     Mouth/Throat:     Pharynx: No oropharyngeal exudate.  Eyes:     General: Lids are normal.     Conjunctiva/sclera: Conjunctivae normal.  Cardiovascular:     Rate and Rhythm: Normal rate and regular rhythm.     Heart sounds: Normal heart sounds, S1 normal and S2 normal.  Pulmonary:     Breath sounds: No decreased breath sounds, wheezing, rhonchi or rales.  Abdominal:     Palpations: Abdomen is soft.     Tenderness: There is no abdominal tenderness.  Musculoskeletal:     Right lower leg: No swelling.     Left lower leg: No swelling.  Skin:    General: Skin is warm.     Findings: No rash.  Neurological:     Mental Status: She is alert.     Comments: Answers a few questions.       Data Reviewed: Basic Metabolic Panel: Recent Labs  Lab 03/20/20 0633 03/21/20 0527  NA 133* 136  K 4.4 3.9  CL 94* 102  CO2 26 25   GLUCOSE 372* 141*  BUN 21 10  CREATININE 1.00 0.75  CALCIUM 9.3 8.8*   CBC: Recent Labs  Lab 03/20/20 0633 03/21/20 0527  WBC 11.1* 9.7  NEUTROABS 9.0*  --   HGB 10.8* 10.5*  HCT 31.9* 32.1*  MCV 76.1* 78.3*  PLT 267 220    CBG: Recent Labs  Lab 03/20/20 2126 03/20/20 2351 03/21/20 0421 03/21/20 0748 03/21/20 1158  GLUCAP 143* 149* 81 148* 142*    Recent Results (from the past 240 hour(s))  Respiratory Panel by RT PCR (Flu A&B, Covid) - Nasopharyngeal Swab     Status: None   Collection Time: 03/20/20 10:21 AM   Specimen: Nasopharyngeal Swab  Result Value Ref Range Status   SARS Coronavirus 2 by RT PCR NEGATIVE NEGATIVE Final    Comment: (Curtis) SARS-CoV-2 target nucleic acids are NOT DETECTED.  The SARS-CoV-2 RNA is generally detectable in upper respiratoy specimens during the acute phase of infection. The lowest concentration of SARS-CoV-2 viral copies this assay can detect is 131 copies/mL. A negative result does not preclude SARS-Cov-2 infection and should not be used as the sole basis for treatment or other patient management decisions. A negative result may occur with  improper specimen collection/handling, submission of specimen other than  nasopharyngeal swab, presence of viral mutation(s) within the areas targeted by this assay, and inadequate number of viral copies (<131 copies/mL). A negative result must be combined with clinical observations, patient history, and epidemiological information. The expected result is Negative.  Fact Sheet for Patients:  PinkCheek.be  Fact Sheet for Healthcare Providers:  GravelBags.it  This test is no t yet approved or cleared by the [place] FDA and  has been authorized for detection and/or diagnosis of SARS-CoV-2 by FDA under an Emergency Use Authorization (EUA). This EUA will remain  in effect (meaning this test can be used) for the duration of  the COVID-19 declaration under Section 564(b)(1) of the Act, 21 U.S.C. section 360bbb-3(b)(1), unless the authorization is terminated or revoked sooner.     Influenza A by PCR NEGATIVE NEGATIVE Final   Influenza B by PCR NEGATIVE NEGATIVE Final    Comment: (Curtis) The Xpert Xpress SARS-CoV-2/FLU/RSV assay is intended as an aid in  the diagnosis of influenza from Nasopharyngeal swab specimens and  should not be used as a sole basis for treatment. Nasal washings and  aspirates are unacceptable for Xpert Xpress SARS-CoV-2/FLU/RSV  testing.  Fact Sheet for Patients: PinkCheek.be  Fact Sheet for Healthcare Providers: GravelBags.it  This test is not yet approved or cleared by the [place] FDA and  has been authorized for detection and/or diagnosis of SARS-CoV-2 by  FDA under an Emergency Use Authorization (EUA). This EUA will remain  in effect (meaning this test can be used) for the duration of the  Covid-19 declaration under Section 564(b)(1) of the Act, 21  U.S.C. section 360bbb-3(b)(1), unless the authorization is  terminated or revoked. Performed at [facility], [address] [place], [place] [address]      Studies: CT Head Wo Contrast  Result Date: 03/20/2020 CLINICAL DATA:  Recent fall with headaches and neck pain, initial encounter EXAM: CT HEAD WITHOUT CONTRAST CT CERVICAL SPINE WITHOUT CONTRAST TECHNIQUE: Multidetector CT imaging of the head and cervical spine was performed following the standard protocol without intravenous contrast. Multiplanar CT image reconstructions of the cervical spine were also generated. COMPARISON:  01/27/2020 FINDINGS: CT HEAD FINDINGS Brain: Chronic atrophic and white matter ischemic changes are again identified and stable. No findings to suggest acute hemorrhage, acute infarction or space-occupying mass lesion are seen. Vascular: No hyperdense vessel or unexpected  calcification. Skull: Normal. Negative for fracture or focal lesion. Sinuses/Orbits: No acute finding. Other: None. CT CERVICAL SPINE FINDINGS Alignment: Within normal limits. Skull base and vertebrae: 7 cervical segments are well visualized. Mild motion artifact limits the evaluation somewhat. Mild osteophytic changes and facet hypertrophic changes are seen. No acute fracture or acute facet abnormality is noted. The odontoid is within normal limits. Soft tissues and spinal canal: Surrounding soft tissue structures demonstrate vascular calcifications. No focal mass lesion or adenopathy is noted. Upper chest: Visualized lung apices are within normal limits. Other: None IMPRESSION: CT of the head: Chronic atrophic and ischemic changes without acute abnormality. CT of the cervical spine: Multilevel degenerative change without acute abnormality. Electronically Signed   By: Inez Catalina M.D.   On: 03/20/2020 08:07   CT Cervical Spine Wo Contrast  Result Date: 03/20/2020 CLINICAL DATA:  Recent fall with headaches and neck pain, initial encounter EXAM: CT HEAD WITHOUT CONTRAST CT CERVICAL SPINE WITHOUT CONTRAST TECHNIQUE: Multidetector CT imaging of the head and cervical spine was performed following the standard protocol without intravenous contrast. Multiplanar CT image reconstructions of the cervical spine were also generated.  COMPARISON:  01/27/2020 FINDINGS: CT HEAD FINDINGS Brain: Chronic atrophic and white matter ischemic changes are again identified and stable. No findings to suggest acute hemorrhage, acute infarction or space-occupying mass lesion are seen. Vascular: No hyperdense vessel or unexpected calcification. Skull: Normal. Negative for fracture or focal lesion. Sinuses/Orbits: No acute finding. Other: None. CT CERVICAL SPINE FINDINGS Alignment: Within normal limits. Skull base and vertebrae: 7 cervical segments are well visualized. Mild motion artifact limits the evaluation somewhat. Mild osteophytic  changes and facet hypertrophic changes are seen. No acute fracture or acute facet abnormality is noted. The odontoid is within normal limits. Soft tissues and spinal canal: Surrounding soft tissue structures demonstrate vascular calcifications. No focal mass lesion or adenopathy is noted. Upper chest: Visualized lung apices are within normal limits. Other: None IMPRESSION: CT of the head: Chronic atrophic and ischemic changes without acute abnormality. CT of the cervical spine: Multilevel degenerative change without acute abnormality. Electronically Signed   By: Inez Catalina M.D.   On: 03/20/2020 08:07   CT Hip Right Wo Contrast  Result Date: 03/20/2020 CLINICAL DATA:  Right hip pain after a fall out of bed last night. Initial encounter. EXAM: CT OF THE RIGHT HIP WITHOUT CONTRAST TECHNIQUE: Multidetector CT imaging of the right hip was performed according to the standard protocol. Multiplanar CT image reconstructions were also generated. COMPARISON:  Plain films right hip earlier today. FINDINGS: Bones/Joint/Cartilage The right hip is located. There is no fracture. Minimal right hip osteoarthritis is present. There is moderate degenerative disease at the visualized right SI joint. No lytic or sclerotic lesion. No hip joint effusion. Ligaments Suboptimally assessed by CT. Muscles and Tendons Appear in tact. No muscle atrophy. No intramuscular hematoma or other fluid collection. Soft tissues Atherosclerosis is noted. IMPRESSION: No acute abnormality. 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 Electronically Signed   By: Inge Rise M.D.   On: 03/20/2020 09:25   DG Chest Portable 1 View  Result Date: 03/20/2020 CLINICAL DATA:  Un witnessed fall with chest pain, initial encounter EXAM: PORTABLE CHEST 1 VIEW COMPARISON:  None. FINDINGS: Cardiac shadow is within normal limits. Aortic calcifications are seen. The lungs are clear. Postsurgical changes in the left axilla are noted. No acute bony  abnormality is noted. IMPRESSION: No active disease. Electronically Signed   By: Inez Catalina M.D.   On: 03/20/2020 08:13   DG Hip Unilat W or Wo Pelvis 2-3 Views Right  Result Date: 03/20/2020 CLINICAL DATA:  Right hip pain after fall EXAM: DG HIP (WITH OR WITHOUT PELVIS) 2-3V RIGHT COMPARISON:  None. FINDINGS: Subtle cortical irregularity along the lateral margin of the right femoral neck suspicious for a nondisplaced fracture. Osseous structures appear demineralized. Normal alignment without dislocation. Vascular calcifications. Partially visualized IVC filter. IMPRESSION: Findings raise suspicion for a nondisplaced fracture of the right femoral neck. Further evaluation with MRI or CT can be performed to further evaluate. Electronically Signed   By: Davina Poke D.O.   On: 03/20/2020 08:17    Scheduled Meds: . amLODipine  5 mg Oral Daily  . aspirin  81 mg Oral Daily  . donepezil  10 mg Oral QHS  . enoxaparin (LOVENOX) injection  40 mg Subcutaneous Q24H  . folic acid  1 mg Oral Daily  . insulin aspart  0-15 Units Subcutaneous Q4H  . multivitamin with minerals  1 tablet Oral Daily  . pyridOXINE  50 mg Oral Daily  . traZODone  50 mg Oral QHS   Continuous Infusions: .  sodium chloride 40 mL/hr at 03/21/20 0827  . piperacillin-tazobactam (ZOSYN)  IV      Assessment/Plan:  1. Acute metabolic encephalopathy secondary to urinary tract infection.  Mental status little bit better this afternoon as per the daughter. 2. Acute cystitis without hematuria.  Case discussed with infectious disease pharmacist and antibiotic switch from meropenem over to Zosyn.  Unfortunately urine culture not sent from the ER.  We are able to add urine culture today onto yesterday's urine analysis.  May take an extra day for results to come back.  With history of multidrug-resistant UTI would like to have the urine culture back prior to disposition. 3. Type 2 diabetes mellitus with hyperglycemia.  Patient on insulin  sliding scale for right now 4. Dementia on donezepil. 5. Generalized weakness we will get physical therapy evaluation    Code Status:     Code Status Orders  (From admission, onward)         Start     Ordered   03/20/20 1240  Full code  Continuous        03/20/20 1241        Code Status History    This patient has a current code status but no historical code status.   Advance Care Planning Activity     Family Communication: Spoke with daughter on the phone Disposition Plan: Status is: Inpatient  Dispo: The patient is from: [place]              Anticipated d/c is to: [place]              Anticipated d/c date is: We will need to get urine culture results back prior to disposition              Patient currently being treated for acute cystitis and acute metabolic encephalopathy.  Antibiotics:  IV Zosyn  Time spent: 28 minutes  [place]

## 2020-03-21 NOTE — Evaluation (Signed)
Clinical/Bedside Swallow Evaluation Patient Details  Name: Carol Curtis MRN: [id number] Date of Birth: Mar 02, 1936  Today's Date: 03/21/2020 Time: SLP Start Time (ACUTE ONLY): 7341 SLP Stop Time (ACUTE ONLY): 1321 SLP Time Calculation (min) (ACUTE ONLY): 13 min  Past Medical History:  Past Medical History:  Diagnosis Date  . Breast cancer ([place])    left breast ca  . Dementia ([place])   . Diabetes mellitus without complication [facility])    Past Surgical History: History reviewed. No pertinent surgical history. HPI:  Carol Curtis is a 84 y.o. female with medical history significant for dementia, diabetes mellitus, history of left breast cancer who was brought to the emergency room via EMS after an unwitnessed fall.  Pt diagnosed with acute metabolic encephalopathy secondary to urinary tract infection. All imaging was negative for any acute abnormalities.    Assessment / Plan / Recommendation Clinical Impression  Pt appears less altered than at admission. As a result, pt is appropriate for PO trials. Pt presents with grossly adequate oropharyngeal abilities when consuming puree and thin liquids via straw. Baseline diet is  currently unavaible. Given pt's current cognitive status (unable to follow directions), recent hospitalizations for lethargy and recurrent UTIs, recommend dysphagia 1 diet with thin liquids may use straw, medicine crushed in applesauce.   Will defer further diet upgrade to pt's facility.   SLP Visit Diagnosis: Dysphagia, unspecified (R13.10)    Aspiration Risk  Mild aspiration risk    Diet Recommendation Dysphagia 1 (Puree);Thin liquid   Liquid Administration via: Straw Medication Administration: Crushed with puree Supervision: Full supervision/cueing for compensatory strategies;Staff to assist with self feeding Compensations: Minimize environmental distractions;Slow rate;Small sips/bites Postural Changes: Seated upright at 90 degrees    Other  Recommendations  Oral Care Recommendations: Oral care BID   Follow up Recommendations None      Frequency and Duration   N/A         Prognosis   N/A     Swallow Study   General Date of Onset: 03/20/20 HPI: Carol Curtis is a 84 y.o. female with medical history significant for dementia, diabetes mellitus, history of left breast cancer who was brought to the emergency room via EMS after an unwitnessed fall.  Pt diagnosed with acute metabolic encephalopathy secondary to urinary tract infection. All imaging was negative for any acute abnormalities.  Type of Study: Bedside Swallow Evaluation Previous Swallow Assessment: none in chart Diet Prior to this Study: NPO (d/t AMS) Temperature Spikes Noted: No Respiratory Status: Room air History of Recent Intubation: No Behavior/Cognition: Alert;Cooperative;Pleasant mood Oral Cavity Assessment: Within Functional Limits Oral Cavity - Dentition: Adequate natural dentition Self-Feeding Abilities: Total assist Patient Positioning: Upright in bed Baseline Vocal Quality: Not observed Volitional Cough: Cognitively unable to elicit Volitional Swallow: Unable to elicit    Oral/Motor/Sensory Function Overall Oral Motor/Sensory Function: Within functional limits   Ice Chips Ice chips: Not tested   Thin Liquid Thin Liquid: Within functional limits Presentation: Cup;Straw    Nectar Thick Nectar Thick Liquid: Not tested   Honey Thick Honey Thick Liquid: Not tested   Puree Puree: Within functional limits Presentation: Spoon   Solid     Solid: Not tested     Carol Curtis B. Rutherford Nail, M.S., CCC-SLP, [place] Office [phone]  [name] 03/21/2020,2:47 PM

## 2020-03-22 ENCOUNTER — Inpatient Hospital Stay: Payer: Medicare PPO

## 2020-03-22 DIAGNOSIS — E785 Hyperlipidemia, unspecified: Secondary | ICD-10-CM

## 2020-03-22 DIAGNOSIS — E1169 Type 2 diabetes mellitus with other specified complication: Secondary | ICD-10-CM

## 2020-03-22 DIAGNOSIS — G9341 Metabolic encephalopathy: Secondary | ICD-10-CM | POA: Diagnosis not present

## 2020-03-22 DIAGNOSIS — N3000 Acute cystitis without hematuria: Secondary | ICD-10-CM | POA: Diagnosis not present

## 2020-03-22 DIAGNOSIS — G309 Alzheimer's disease, unspecified: Secondary | ICD-10-CM | POA: Diagnosis not present

## 2020-03-22 DIAGNOSIS — M7989 Other specified soft tissue disorders: Secondary | ICD-10-CM

## 2020-03-22 LAB — GLUCOSE, CAPILLARY
Glucose-Capillary: 107 mg/dL — ABNORMAL HIGH (ref 70–99)
Glucose-Capillary: 156 mg/dL — ABNORMAL HIGH (ref 70–99)
Glucose-Capillary: 163 mg/dL — ABNORMAL HIGH (ref 70–99)
Glucose-Capillary: 174 mg/dL — ABNORMAL HIGH (ref 70–99)
Glucose-Capillary: 234 mg/dL — ABNORMAL HIGH (ref 70–99)
Glucose-Capillary: 257 mg/dL — ABNORMAL HIGH (ref 70–99)
Glucose-Capillary: 273 mg/dL — ABNORMAL HIGH (ref 70–99)

## 2020-03-22 MED ORDER — CHLORHEXIDINE GLUCONATE CLOTH 2 % EX PADS
6.0000 | MEDICATED_PAD | Freq: Every day | CUTANEOUS | Status: DC
  Administered 2020-03-22 – 2020-03-31 (×7): 6 via TOPICAL

## 2020-03-22 MED ORDER — APIXABAN 5 MG PO TABS
5.0000 mg | ORAL_TABLET | Freq: Two times a day (BID) | ORAL | Status: DC
  Administered 2020-03-29 – 2020-04-01 (×5): 5 mg via ORAL
  Filled 2020-03-22 (×7): qty 1

## 2020-03-22 MED ORDER — APIXABAN 5 MG PO TABS
10.0000 mg | ORAL_TABLET | Freq: Two times a day (BID) | ORAL | Status: AC
  Administered 2020-03-23 – 2020-03-29 (×12): 10 mg via ORAL
  Filled 2020-03-22 (×13): qty 2

## 2020-03-22 MED ORDER — MORPHINE SULFATE (PF) 2 MG/ML IV SOLN
2.0000 mg | Freq: Once | INTRAVENOUS | Status: AC
  Administered 2020-03-22: 2 mg via INTRAVENOUS

## 2020-03-22 MED ORDER — APIXABAN 5 MG PO TABS: 5.0000 mg | ORAL_TABLET | Freq: Two times a day (BID) | ORAL | Status: DC

## 2020-03-22 MED ORDER — MORPHINE SULFATE (PF) 2 MG/ML IV SOLN
INTRAVENOUS | Status: AC
Start: 2020-03-22 — End: 2020-03-22
  Filled 2020-03-22: qty 1

## 2020-03-22 MED ORDER — MORPHINE SULFATE (PF) 2 MG/ML IV SOLN: 1.0000 mg | Freq: Once | INTRAVENOUS | Status: DC

## 2020-03-22 MED ORDER — APIXABAN 5 MG PO TABS
10.0000 mg | ORAL_TABLET | Freq: Two times a day (BID) | ORAL | Status: DC
  Administered 2020-03-22: 10 mg via ORAL
  Filled 2020-03-22: qty 2

## 2020-03-22 MED ORDER — POLYETHYLENE GLYCOL 3350 17 G PO PACK
17.0000 g | PACK | Freq: Every day | ORAL | Status: DC
  Administered 2020-03-23 – 2020-03-31 (×8): 17 g via ORAL
  Filled 2020-03-22 (×9): qty 1

## 2020-03-22 NOTE — Progress Notes (Signed)
Patient ID: Carol Curtis, female   DOB: Sep 14, 1935, 84 y.o.   MRN: [id number] [facility] PROGRESS NOTE  Carol Curtis GUR:[id number] DOB: [birth year] DOA: 03/20/2020 PCP: [facility]  HPI/Subjective: Patient awakened from sleep and went back to sleep.   Objective: Vitals:   03/21/20 2310 03/22/20 0809  BP: 98/64 (!) 134/50  Pulse: 75 66  Resp: 15 18  Temp: 97.9 F (36.6 C) 97.7 F (36.5 C)  SpO2: 98% 97%    Intake/Output Summary (Last 24 hours) at 03/22/2020 1247 Last data filed at 03/22/2020 0539 Gross per 24 hour  Intake --  Output 700 ml  Net -700 ml   Filed Weights   03/20/20 0605  Weight: 72.6 kg    ROS: Review of Systems  Unable to perform ROS: Acuity of condition   Exam: Physical Exam HENT:     Head: Normocephalic.     Mouth/Throat:     Pharynx: No oropharyngeal exudate.  Eyes:     General: Lids are normal.     Conjunctiva/sclera: Conjunctivae normal.  Cardiovascular:     Rate and Rhythm: Normal rate and regular rhythm.     Heart sounds: Normal heart sounds, S1 normal and S2 normal.  Pulmonary:     Breath sounds: No decreased breath sounds, wheezing, rhonchi or rales.  Abdominal:     Palpations: Abdomen is soft.     Tenderness: There is no abdominal tenderness.  Musculoskeletal:     Right lower leg: Swelling present.     Left lower leg: No swelling.  Skin:    General: Skin is warm.     Findings: No rash.  Neurological:     Mental Status: She is lethargic.       Data Reviewed: Basic Metabolic Panel: Recent Labs  Lab 03/20/20 0633 03/21/20 0527  NA 133* 136  K 4.4 3.9  CL 94* 102  CO2 26 25  GLUCOSE 372* 141*  BUN 21 10  CREATININE 1.00 0.75  CALCIUM 9.3 8.8*   CBC: Recent Labs  Lab 03/20/20 0633 03/21/20 0527  WBC 11.1* 9.7  NEUTROABS 9.0*  --   HGB 10.8* 10.5*  HCT 31.9* 32.1*  MCV 76.1* 78.3*  PLT 267 220    CBG: Recent Labs  Lab 03/21/20 2312 03/22/20 0043 03/22/20 0349 03/22/20 0805  03/22/20 1212  GLUCAP 175* 163* 174* 107* 156*    Recent Results (from the past 240 hour(s))  Urine Culture     Status: Abnormal (Preliminary result)   Collection Time: 03/20/20  8:09 AM   Specimen: Urine, Clean Catch  Result Value Ref Range Status   Specimen Description   Final    URINE, CLEAN CATCH Performed at [facility], [address], [place], [place] [address]    Special Requests   Final    NONE Performed at [facility], [address] [place], [place] [address]    Culture >=100,000 COLONIES/mL ESCHERICHIA COLI (A)  Final   Report Status PENDING  Incomplete  Respiratory Panel by RT PCR (Flu A&B, Covid) - Nasopharyngeal Swab     Status: None   Collection Time: 03/20/20 10:21 AM   Specimen: Nasopharyngeal Swab  Result Value Ref Range Status   SARS Coronavirus 2 by RT PCR NEGATIVE NEGATIVE Final    Comment: (NOTE) SARS-CoV-2 target nucleic acids are NOT DETECTED.  The SARS-CoV-2 RNA is generally detectable in upper respiratoy specimens during the acute phase of infection. The lowest concentration of SARS-CoV-2 viral copies this assay  can detect is 131 copies/mL. A negative result does not preclude SARS-Cov-2 infection and should not be used as the sole basis for treatment or other patient management decisions. A negative result may occur with  improper specimen collection/handling, submission of specimen other than nasopharyngeal swab, presence of viral mutation(s) within the areas targeted by this assay, and inadequate number of viral copies (<131 copies/mL). A negative result must be combined with clinical observations, patient history, and epidemiological information. The expected result is Negative.  Fact Sheet for Patients:  PinkCheek.be  Fact Sheet for Healthcare Providers:  GravelBags.it  This test is no t yet approved or cleared by the [place] FDA and  has been authorized  for detection and/or diagnosis of SARS-CoV-2 by FDA under an Emergency Use Authorization (EUA). This EUA will remain  in effect (meaning this test can be used) for the duration of the COVID-19 declaration under Section 564(b)(1) of the Act, 21 U.S.C. section 360bbb-3(b)(1), unless the authorization is terminated or revoked sooner.     Influenza A by PCR NEGATIVE NEGATIVE Final   Influenza B by PCR NEGATIVE NEGATIVE Final    Comment: (NOTE) The Xpert Xpress SARS-CoV-2/FLU/RSV assay is intended as an aid in  the diagnosis of influenza from Nasopharyngeal swab specimens and  should not be used as a sole basis for treatment. Nasal washings and  aspirates are unacceptable for Xpert Xpress SARS-CoV-2/FLU/RSV  testing.  Fact Sheet for Patients: PinkCheek.be  Fact Sheet for Healthcare Providers: GravelBags.it  This test is not yet approved or cleared by the [place] FDA and  has been authorized for detection and/or diagnosis of SARS-CoV-2 by  FDA under an Emergency Use Authorization (EUA). This EUA will remain  in effect (meaning this test can be used) for the duration of the  Covid-19 declaration under Section 564(b)(1) of the Act, 21  U.S.C. section 360bbb-3(b)(1), unless the authorization is  terminated or revoked. Performed at [facility], [address], [place], [place] [address]       Scheduled Meds: . morphine      . aspirin  81 mg Oral Daily  . Chlorhexidine Gluconate Cloth  6 each Topical Daily  . donepezil  10 mg Oral QHS  . enoxaparin (LOVENOX) injection  40 mg Subcutaneous Q24H  . folic acid  1 mg Oral Daily  . insulin aspart  0-15 Units Subcutaneous Q4H  . multivitamin with minerals  1 tablet Oral Daily  . pyridOXINE  50 mg Oral Daily  . traZODone  50 mg Oral QHS   Continuous Infusions: . sodium chloride 40 mL/hr at 03/22/20 0049  . piperacillin-tazobactam (ZOSYN)  IV 3.375 g (03/22/20  1235)    Assessment/Plan:  1. Acute metabolic encephalopathy secondary to urinary tract infection.  Mental status this morning impaired.  Patient awakened and then went right back to sleep. 2. Acute cystitis without hematuria.  Currently on Zosyn.  E. coli growing out of the urine culture.  Awaiting sensitivities with history of ESBL on the last urine culture. 3. Type 2 diabetes mellitus with hyperglycemia.  Patient on insulin sliding scale for right now. 4. Dementia on donezepil 5. Generalized weakness.  Physical therapy recommended rehab.  We will get transitional care team to evaluate 6. Swelling right lower extremity will get ultrasound to rule out DVT     Code Status:     Code Status Orders  (From admission, onward)         Start     Ordered  03/20/20 1240  Full code  Continuous        03/20/20 1241        Code Status History    This patient has a current code status but no historical code status.   Advance Care Planning Activity     Family Communication: Spoke with Whitney Muse on the phone Disposition Plan: Status is: Inpatient  Dispo: The patient is from: [place]              Anticipated d/c is to: [place] versus rehab              Anticipated d/c date is: 03/24/2020 versus 03/25/2020              Patient currently being treated for acute metabolic encephalopathy with UTI.  Antibiotics:  Zosyn  Time spent: 26 minutes  Zyrus Hetland [employer]

## 2020-03-22 NOTE — Evaluation (Signed)
Physical Therapy Evaluation Patient Details Name: Carol Curtis MRN: [id number] DOB: January 10, 1936 Today's Date: 03/22/2020   History of Present Illness  Carol Curtis is a 84 y.o. female with medical history significant for dementia, diabetes mellitus, history of left breast cancer who was brought to the emergency room via EMS after an unwitnessed fall.  Pt diagnosed with acute metabolic encephalopathy secondary to urinary tract infection. All imaging was negative for any acute abnormalities.     Clinical Impression  Pt received in supine position upon entering into the room.  Pt asleep, however was able to wake with verbal greeting.  Pt is alert to person only at this time.  Pt educated on PT services and location of stay.  Pt seems unreceptive to verbal commands at this time.  Pt was able to uncross legs when asked to do so with tactile cuing as well.  Pt not wearing compression devices or stockings and has bilateral pitting edema, with R>L.  Difficult to assess color of skin in the RLE, however warm to touch.  Pt elicited painful response when performing Homan's Sign.  Nursing and attending MD notified through phone and secure chat.  Pt unable to perform exercises and has rigidity when performing PROM exercises as listed below.  Pt will be seen by PT during stay for trial run for 3-5 visits to monitor progress and determine appropriateness of skilled therapy.  Current recommendation is to transfer to SNF at conclusion of stay.      Follow Up Recommendations SNF    Equipment Recommendations       Recommendations for Other Services       Precautions / Restrictions Restrictions Weight Bearing Restrictions: No      Mobility  Bed Mobility Overal bed mobility: Needs Assistance Bed Mobility: Rolling           General bed mobility comments: Pt unable to do this even with command.  Attempted to assist pt, but difficult to perform due to rigidity.  Transfers                     Ambulation/Gait                Stairs            Wheelchair Mobility    Modified Rankin (Stroke Patients Only)       Balance                                             Pertinent Vitals/Pain Pain Assessment: Faces Faces Pain Scale: Hurts whole lot Pain Location: R calf when squeezed. Pain Intervention(s): Limited activity within patient's tolerance;Monitored during session    Home Living                        Prior Function                 Hand Dominance        Extremity/Trunk Assessment   Upper Extremity Assessment Upper Extremity Assessment: Difficult to assess due to impaired cognition    Lower Extremity Assessment Lower Extremity Assessment: Difficult to assess due to impaired cognition       Communication      Cognition Arousal/Alertness: Lethargic Behavior During Therapy: Flat affect Overall Cognitive Status: History of cognitive impairments - at baseline  General Comments: Pt only alerted to person.  This is consistent with baseline functionality.      General Comments      Exercises Total Joint Exercises Ankle Circles/Pumps: PROM;Both;15 reps;Supine Heel Slides: PROM;Both;10 reps;Supine Hip ABduction/ADduction: PROM;Both;10 reps;Supine Straight Leg Raises: PROM;Both;10 reps;Supine Other Exercises Other Exercises: Pt educated on roles of PT and services provided during hospital stay.  Pt may not have recevied education well due to cognition at evaluation.   Assessment/Plan    PT Assessment Patient needs continued PT services  PT Problem List Decreased strength;Decreased range of motion;Decreased activity tolerance;Decreased mobility;Decreased cognition;Pain       PT Treatment Interventions DME instruction;Gait training;Functional mobility training;Therapeutic activities;Therapeutic exercise;Balance training;Neuromuscular re-education    PT  Goals (Current goals can be found in the Care Plan section)  Acute Rehab PT Goals PT Goal Formulation: Patient unable to participate in goal setting    Frequency Min 2X/week   Barriers to discharge        Co-evaluation               AM-PAC PT "6 Clicks" Mobility  Outcome Measure Help needed turning from your back to your side while in a flat bed without using bedrails?: Total Help needed moving from lying on your back to sitting on the side of a flat bed without using bedrails?: Total Help needed moving to and from a bed to a chair (including a wheelchair)?: Total Help needed standing up from a chair using your arms (e.g., wheelchair or bedside chair)?: Total Help needed to walk in hospital room?: Total Help needed climbing 3-5 steps with a railing? : Total 6 Click Score: 6    End of Session Equipment Utilized During Treatment: Gait belt Activity Tolerance: Patient limited by lethargy Patient left: in bed;with call bell/phone within reach;with bed alarm set Nurse Communication: Mobility status;Other (comment) (Nursing and MD notified of pain in calf region with Homan's Sign.) PT Visit Diagnosis: Muscle weakness (generalized) (M62.81);Difficulty in walking, not elsewhere classified (R26.2)    Time: 5364-6803 PT Time Calculation (min) (ACUTE ONLY): 11 min   Charges:   PT Evaluation $PT Eval Low Complexity: 1 Low PT Treatments $Therapeutic Exercise: 8-22 mins        Gwenlyn Saran, PT, DPT 03/22/20, 9:48 AM

## 2020-03-22 NOTE — Progress Notes (Signed)
ANTICOAGULATION CONSULT NOTE - Follow Up Consult  Pharmacy Consult for Apixaban  Indication: DVT  No Known Allergies  Patient Measurements: Height: 5\' 4"  (162.6 cm) Weight: 72.6 kg (160 lb) IBW/kg (Calculated) : 54.7  Vital Signs: Temp: 97.7 F (36.5 C) (10/02 0809) Temp Source: Axillary (10/02 0809) BP: 134/50 (10/02 0809) Pulse Rate: 66 (10/02 0809)  Labs: Recent Labs    03/20/20 0633 03/21/20 0527  HGB 10.8* 10.5*  HCT 31.9* 32.1*  PLT 267 220  CREATININE 1.00 0.75    Estimated Creatinine Clearance: 53 mL/min (by C-G formula based on SCr of 0.75 mg/dL).   Assessment: 84 yo female admitted with Acute metabolic encephalopathy secondary to urinary tract infection. Patient also found to have swelling of right lower extremity. [place]  POSITIVE for extensive bilateral calf and femoropopliteal occlusive DVT.  Plan:  Will start apixaban 10mg  BID x 7 days followed by apixaban 5mg  BID daily.  Monitor CBC at least every 3 days while inpatient per protocol.   Pernell Dupre, PharmD, BCPS Clinical Pharmacist 03/22/2020 1:53 PM

## 2020-03-23 DIAGNOSIS — G9341 Metabolic encephalopathy: Secondary | ICD-10-CM | POA: Diagnosis not present

## 2020-03-23 DIAGNOSIS — I82433 Acute embolism and thrombosis of popliteal vein, bilateral: Secondary | ICD-10-CM

## 2020-03-23 DIAGNOSIS — Z1612 Extended spectrum beta lactamase (ESBL) resistance: Secondary | ICD-10-CM

## 2020-03-23 DIAGNOSIS — N390 Urinary tract infection, site not specified: Secondary | ICD-10-CM | POA: Diagnosis not present

## 2020-03-23 DIAGNOSIS — R338 Other retention of urine: Secondary | ICD-10-CM

## 2020-03-23 DIAGNOSIS — B9629 Other Escherichia coli [E. coli] as the cause of diseases classified elsewhere: Secondary | ICD-10-CM

## 2020-03-23 DIAGNOSIS — G309 Alzheimer's disease, unspecified: Secondary | ICD-10-CM | POA: Diagnosis not present

## 2020-03-23 LAB — CBC
HCT: 30.2 % — ABNORMAL LOW (ref 36.0–46.0)
Hemoglobin: 9.8 g/dL — ABNORMAL LOW (ref 12.0–15.0)
MCH: 25.1 pg — ABNORMAL LOW (ref 26.0–34.0)
MCHC: 32.5 g/dL (ref 30.0–36.0)
MCV: 77.2 fL — ABNORMAL LOW (ref 80.0–100.0)
Platelets: 263 10*3/uL (ref 150–400)
RBC: 3.91 MIL/uL (ref 3.87–5.11)
RDW: 14.6 % (ref 11.5–15.5)
WBC: 8.3 10*3/uL (ref 4.0–10.5)
nRBC: 0 % (ref 0.0–0.2)

## 2020-03-23 LAB — GLUCOSE, CAPILLARY
Glucose-Capillary: 141 mg/dL — ABNORMAL HIGH (ref 70–99)
Glucose-Capillary: 155 mg/dL — ABNORMAL HIGH (ref 70–99)
Glucose-Capillary: 173 mg/dL — ABNORMAL HIGH (ref 70–99)
Glucose-Capillary: 177 mg/dL — ABNORMAL HIGH (ref 70–99)
Glucose-Capillary: 259 mg/dL — ABNORMAL HIGH (ref 70–99)
Glucose-Capillary: 301 mg/dL — ABNORMAL HIGH (ref 70–99)

## 2020-03-23 LAB — URINE CULTURE: Culture: >=100000 — AB

## 2020-03-23 MED ORDER — SODIUM CHLORIDE 0.9 % IV SOLN
1.0000 g | Freq: Three times a day (TID) | INTRAVENOUS | Status: AC
  Administered 2020-03-23 – 2020-03-25 (×9): 1 g via INTRAVENOUS
  Filled 2020-03-23 (×10): qty 1

## 2020-03-23 MED ORDER — INSULIN ASPART 100 UNIT/ML ~~LOC~~ SOLN
0.0000 [IU] | Freq: Every day | SUBCUTANEOUS | Status: DC
  Administered 2020-03-23 – 2020-03-24 (×2): 3 [IU] via SUBCUTANEOUS
  Administered 2020-03-25 – 2020-03-26 (×2): 2 [IU] via SUBCUTANEOUS
  Filled 2020-03-23 (×4): qty 1

## 2020-03-23 MED ORDER — INSULIN ASPART 100 UNIT/ML ~~LOC~~ SOLN
0.0000 [IU] | Freq: Three times a day (TID) | SUBCUTANEOUS | Status: DC
  Administered 2020-03-23: 1 [IU] via SUBCUTANEOUS
  Administered 2020-03-24: 2 [IU] via SUBCUTANEOUS
  Administered 2020-03-24 – 2020-03-25 (×3): 3 [IU] via SUBCUTANEOUS
  Administered 2020-03-25 (×2): 2 [IU] via SUBCUTANEOUS
  Administered 2020-03-26: 3 [IU] via SUBCUTANEOUS
  Administered 2020-03-26: 2 [IU] via SUBCUTANEOUS
  Administered 2020-03-26 – 2020-03-27 (×2): 3 [IU] via SUBCUTANEOUS
  Administered 2020-03-27: 2 [IU] via SUBCUTANEOUS
  Administered 2020-03-28: 1 [IU] via SUBCUTANEOUS
  Administered 2020-03-28: 2 [IU] via SUBCUTANEOUS
  Administered 2020-03-29: 1 [IU] via SUBCUTANEOUS
  Administered 2020-03-29 – 2020-03-30 (×3): 2 [IU] via SUBCUTANEOUS
  Administered 2020-03-30: 1 [IU] via SUBCUTANEOUS
  Administered 2020-03-31: 5 [IU] via SUBCUTANEOUS
  Administered 2020-04-01: 3 [IU] via SUBCUTANEOUS
  Administered 2020-04-01: 5 [IU] via SUBCUTANEOUS
  Filled 2020-03-23 (×17): qty 1

## 2020-03-23 NOTE — Progress Notes (Signed)
Patient ID: Carol Curtis, female   DOB: 1935/12/05, 84 y.o.   MRN: [id number] [facility] PROGRESS NOTE  YERALDY SPIKE NFA:[id number] DOB: 1936/04/27 DOA: 03/20/2020 PCP: [facility]  HPI/Subjective: Patient awake and alert and was eating breakfast this morning when I saw her.  Answered a few yes or no questions.  She offers no complaints.  No pain in the legs.  No shortness of breath or chest pain.  Objective: Vitals:   03/23/20 0034 03/23/20 0725  BP: (!) 123/57 120/63  Pulse: 88 78  Resp: 17 20  Temp: 99.8 F (37.7 C) 98.6 F (37 C)  SpO2: 98% 99%    Intake/Output Summary (Last 24 hours) at 03/23/2020 1328 Last data filed at 03/23/2020 0930 Gross per 24 hour  Intake 1760.94 ml  Output 600 ml  Net 1160.94 ml   Filed Weights   03/20/20 0605  Weight: 72.6 kg    ROS: Review of Systems  Respiratory: Negative for shortness of breath.   Cardiovascular: Negative for chest pain.  Gastrointestinal: Negative for abdominal pain, nausea and vomiting.   Exam: Physical Exam HENT:     Head: Normocephalic.     Mouth/Throat:     Pharynx: No oropharyngeal exudate.  Eyes:     General: Lids are normal.     Conjunctiva/sclera: Conjunctivae normal.  Cardiovascular:     Rate and Rhythm: Normal rate and regular rhythm.     Heart sounds: Normal heart sounds, S1 normal and S2 normal.  Pulmonary:     Breath sounds: Examination of the right-lower field reveals decreased breath sounds. Examination of the left-lower field reveals decreased breath sounds. Decreased breath sounds present. No wheezing, rhonchi or rales.  Abdominal:     Palpations: Abdomen is soft.     Tenderness: There is no abdominal tenderness.  Musculoskeletal:     Right lower leg: Swelling present.     Left lower leg: Swelling present.  Skin:    General: Skin is warm.     Findings: No rash.  Neurological:     Mental Status: She is alert.     Comments: Answers a few yes or no questions        Data Reviewed: Basic Metabolic Panel: Recent Labs  Lab 03/20/20 0633 03/21/20 0527  NA 133* 136  K 4.4 3.9  CL 94* 102  CO2 26 25  GLUCOSE 372* 141*  BUN 21 10  CREATININE 1.00 0.75  CALCIUM 9.3 8.8*   CBC: Recent Labs  Lab 03/20/20 0633 03/21/20 0527 03/23/20 0416  WBC 11.1* 9.7 8.3  NEUTROABS 9.0*  --   --   HGB 10.8* 10.5* 9.8*  HCT 31.9* 32.1* 30.2*  MCV 76.1* 78.3* 77.2*  PLT 267 220 263    CBG: Recent Labs  Lab 03/22/20 2247 03/23/20 0036 03/23/20 0414 03/23/20 0727 03/23/20 1150  GLUCAP 257* 173* 177* 155* 259*    Recent Results (from the past 240 hour(s))  Urine Culture     Status: Abnormal   Collection Time: 03/20/20  8:09 AM   Specimen: Urine, Clean Catch  Result Value Ref Range Status   Specimen Description   Final    URINE, CLEAN CATCH Performed at [facility], [address], [place], [place] [address]    Special Requests   Final    NONE Performed at [facility], [address] [place], [place] [address]    Culture (A)  Final    >=100,000 COLONIES/mL ESCHERICHIA COLI Confirmed Extended Spectrum  Beta-Lactamase Producer (ESBL).  In bloodstream infections from ESBL organisms, carbapenems are preferred over piperacillin/tazobactam. They are shown to have a lower risk of mortality.    Report Status 03/23/2020 FINAL  Final   Organism ID, Bacteria ESCHERICHIA COLI (A)  Final      Susceptibility   Escherichia coli - MIC*    AMPICILLIN >=32 RESISTANT Resistant     CEFAZOLIN >=64 RESISTANT Resistant     CEFTRIAXONE >=64 RESISTANT Resistant     CIPROFLOXACIN >=4 RESISTANT Resistant     GENTAMICIN <=1 SENSITIVE Sensitive     IMIPENEM <=0.25 SENSITIVE Sensitive     NITROFURANTOIN <=16 SENSITIVE Sensitive     TRIMETH/SULFA >=320 RESISTANT Resistant     AMPICILLIN/SULBACTAM >=32 RESISTANT Resistant     PIP/TAZO 64 INTERMEDIATE Intermediate     * >=100,000 COLONIES/mL ESCHERICHIA COLI  Respiratory Panel by RT PCR  (Flu A&B, Covid) - Nasopharyngeal Swab     Status: None   Collection Time: 03/20/20 10:21 AM   Specimen: Nasopharyngeal Swab  Result Value Ref Range Status   SARS Coronavirus 2 by RT PCR NEGATIVE NEGATIVE Final    Comment: (NOTE) SARS-CoV-2 target nucleic acids are NOT DETECTED.  The SARS-CoV-2 RNA is generally detectable in upper respiratoy specimens during the acute phase of infection. The lowest concentration of SARS-CoV-2 viral copies this assay can detect is 131 copies/mL. A negative result does not preclude SARS-Cov-2 infection and should not be used as the sole basis for treatment or other patient management decisions. A negative result may occur with  improper specimen collection/handling, submission of specimen other than nasopharyngeal swab, presence of viral mutation(s) within the areas targeted by this assay, and inadequate number of viral copies (<131 copies/mL). A negative result must be combined with clinical observations, patient history, and epidemiological information. The expected result is Negative.  Fact Sheet for Patients:  PinkCheek.be  Fact Sheet for Healthcare Providers:  GravelBags.it  This test is no t yet approved or cleared by the [place] FDA and  has been authorized for detection and/or diagnosis of SARS-CoV-2 by FDA under an Emergency Use Authorization (EUA). This EUA will remain  in effect (meaning this test can be used) for the duration of the COVID-19 declaration under Section 564(b)(1) of the Act, 21 U.S.C. section 360bbb-3(b)(1), unless the authorization is terminated or revoked sooner.     Influenza A by PCR NEGATIVE NEGATIVE Final   Influenza B by PCR NEGATIVE NEGATIVE Final    Comment: (NOTE) The Xpert Xpress SARS-CoV-2/FLU/RSV assay is intended as an aid in  the diagnosis of influenza from Nasopharyngeal swab specimens and  should not be used as a sole basis for treatment.  Nasal washings and  aspirates are unacceptable for Xpert Xpress SARS-CoV-2/FLU/RSV  testing.  Fact Sheet for Patients: PinkCheek.be  Fact Sheet for Healthcare Providers: GravelBags.it  This test is not yet approved or cleared by the [place] FDA and  has been authorized for detection and/or diagnosis of SARS-CoV-2 by  FDA under an Emergency Use Authorization (EUA). This EUA will remain  in effect (meaning this test can be used) for the duration of the  Covid-19 declaration under Section 564(b)(1) of the Act, 21  U.S.C. section 360bbb-3(b)(1), unless the authorization is  terminated or revoked. Performed at [facility], [address] [place], [place] [address]      Studies: US Venous Img Lower Bilateral (DVT)  Result Date: 03/22/2020 CLINICAL DATA:  Swelling, pain EXAM: BILATERAL LOWER EXTREMITY VENOUS DOPPLER ULTRASOUND TECHNIQUE:  Gray-scale sonography with compression, as well as color and duplex ultrasound, were performed to evaluate the deep venous system(s) from the level of the common femoral vein through the popliteal and proximal calf veins. COMPARISON:  None. FINDINGS: VENOUS On the left, extensive occlusive thrombus in the common femoral vein, visualized deep femoral vein, proximal and mid superficial femoral vein. Flow signal is identified in the distal superficial femoral vein. There is occlusive thrombus through the visualized popliteal vein. Noncompressible thrombosed posterior tibial vein. Visualized peroneal veins patent. On the right, occlusive thrombus through common femoral, femoral, and popliteal veins with minimal flow signal on color Doppler. Occlusive posterior tibial and peroneal vein DVT. OTHER None. Limitations: none IMPRESSION: 1. POSITIVE for extensive bilateral calf and femoropopliteal occlusive DVT. Electronically Signed   By: Lucrezia Europe M.D.   On: 03/22/2020 13:46    Scheduled Meds: .  apixaban  10 mg Oral BID   Followed by  . [START ON 03/29/2020] apixaban  5 mg Oral BID  . Chlorhexidine Gluconate Cloth  6 each Topical Daily  . donepezil  10 mg Oral QHS  . folic acid  1 mg Oral Daily  . insulin aspart  0-15 Units Subcutaneous Q4H  . multivitamin with minerals  1 tablet Oral Daily  . polyethylene glycol  17 g Oral Daily  . pyridOXINE  50 mg Oral Daily  . traZODone  50 mg Oral QHS   Continuous Infusions: . meropenem (MERREM) IV 1 g (03/23/20 1034)    Assessment/Plan:  1. Acute metabolic encephalopathy secondary to urinary tract infection.  Patient was alert this morning and eating breakfast with the staff. 2. Acute cystitis without hematuria with ESBL E. coli.  Antibiotic switch back to meropenem.  Hesitant upon PICC line.  Hopefully will switch over to fosfomycin upon disposition. 3. Extensive DVT bilateral lower extremities.  Patient did have blood clot previously back in 2014.  Lifelong anticoagulation.  Eliquis started yesterday.  Patient not having any pain in her lower extremities. 4. Dementia.  Continue donezepil. 5. Type 2 diabetes mellitus with hyperglycemia.  Patient on sliding scale insulin.  Hemoglobin A1c 8.8. 6. Weakness.  Physical therapy recommending rehab. 7. Urinary retention.  Discontinue Foley catheter and see if she is able to urinate.  Code Status:     Code Status Orders  (From admission, onward)         Start     Ordered   03/20/20 1240  Full code  Continuous        03/20/20 1241        Code Status History    This patient has a current code status but no historical code status.   Advance Care Planning Activity     Family Communication: Spoke with Whitney Muse on the phone Disposition Plan: Status is: Inpatient  Dispo: The patient is from: [place]              Anticipated d/c is to: Rehab              Anticipated d/c date is: Earliest potential would be 03/25/2020.               Patient currently being treated for ESBL E.  coli infection.  Also found to have DVT.  Time spent: 29 minutes  Rankin

## 2020-03-24 DIAGNOSIS — I82433 Acute embolism and thrombosis of popliteal vein, bilateral: Secondary | ICD-10-CM | POA: Diagnosis not present

## 2020-03-24 DIAGNOSIS — G309 Alzheimer's disease, unspecified: Secondary | ICD-10-CM | POA: Diagnosis not present

## 2020-03-24 DIAGNOSIS — G9341 Metabolic encephalopathy: Secondary | ICD-10-CM | POA: Diagnosis not present

## 2020-03-24 DIAGNOSIS — N390 Urinary tract infection, site not specified: Secondary | ICD-10-CM | POA: Diagnosis not present

## 2020-03-24 LAB — BASIC METABOLIC PANEL
Anion gap: 8 (ref 5–15)
BUN: 10 mg/dL (ref 8–23)
CO2: 29 mmol/L (ref 22–32)
Calcium: 8.6 mg/dL — ABNORMAL LOW (ref 8.9–10.3)
Chloride: 104 mmol/L (ref 98–111)
Creatinine, Ser: 0.67 mg/dL (ref 0.44–1.00)
GFR calc Af Amer: >60 mL/min (ref >60)
GFR calc non Af Amer: >60 mL/min (ref >60)
Glucose, Bld: 183 mg/dL — ABNORMAL HIGH (ref 70–99)
Potassium: 4 mmol/L (ref 3.5–5.1)
Sodium: 141 mmol/L (ref 135–145)

## 2020-03-24 LAB — CBC
HCT: 30.6 % — ABNORMAL LOW (ref 36.0–46.0)
Hemoglobin: 9.5 g/dL — ABNORMAL LOW (ref 12.0–15.0)
MCH: 24.9 pg — ABNORMAL LOW (ref 26.0–34.0)
MCHC: 31 g/dL (ref 30.0–36.0)
MCV: 80.1 fL (ref 80.0–100.0)
Platelets: 284 10*3/uL (ref 150–400)
RBC: 3.82 MIL/uL — ABNORMAL LOW (ref 3.87–5.11)
RDW: 14.5 % (ref 11.5–15.5)
WBC: 7.8 10*3/uL (ref 4.0–10.5)
nRBC: 0 % (ref 0.0–0.2)

## 2020-03-24 LAB — GLUCOSE, CAPILLARY
Glucose-Capillary: 210 mg/dL — ABNORMAL HIGH (ref 70–99)
Glucose-Capillary: 201 mg/dL — ABNORMAL HIGH (ref 70–99)
Glucose-Capillary: 244 mg/dL — ABNORMAL HIGH (ref 70–99)
Glucose-Capillary: 194 mg/dL — ABNORMAL HIGH (ref 70–99)
Glucose-Capillary: 288 mg/dL — ABNORMAL HIGH (ref 70–99)

## 2020-03-24 MED ORDER — INSULIN GLARGINE 100 UNIT/ML ~~LOC~~ SOLN
14.0000 [IU] | Freq: Every day | SUBCUTANEOUS | Status: DC
  Administered 2020-03-25 – 2020-04-01 (×8): 14 [IU] via SUBCUTANEOUS
  Filled 2020-03-24 (×8): qty 0.14

## 2020-03-24 MED ORDER — SODIUM CHLORIDE 0.9 % IV BOLUS
250.0000 mL | Freq: Once | INTRAVENOUS | Status: AC
  Administered 2020-03-24: 250 mL via INTRAVENOUS

## 2020-03-24 MED ORDER — INSULIN GLARGINE 100 UNIT/ML ~~LOC~~ SOLN
8.0000 [IU] | Freq: Every day | SUBCUTANEOUS | Status: DC
  Administered 2020-03-24: 8 [IU] via SUBCUTANEOUS
  Filled 2020-03-24 (×2): qty 0.08

## 2020-03-24 NOTE — TOC Initial Note (Signed)
Transition of Care [facility]) - Initial/Assessment Note    Patient Details  Name: Carol Curtis MRN: [id number] Date of Birth: 07-Jul-1935  [facility]) CM/SW Contact:    Shelbie Ammons, RN Phone Number: 03/24/2020, 3:38 PM  Clinical Narrative:  RNCM placed call to patient's caregiver to discuss SNF placement. Spoke with Marylene Buerger who reports that she is interested in skilled placement and likely thinks patient may need to remain there long term. [name] does not have preference as to which facilities are contacted but requests that some towards [facility] be contacted as well.  RNCM verified PASSR, completed FL-2 and initiated bed search.                  Expected Discharge Plan: Skilled Nursing Facility Barriers to Discharge: No Barriers Identified   Patient Goals and CMS Choice        Expected Discharge Plan and Services Expected Discharge Plan: [place] Choice: [place] Living arrangements for the past 2 months: [place]                                      Prior Living Arrangements/Services Living arrangements for the past 2 months: [place] Lives with:: Facility Resident Patient language and need for interpreter reviewed:: Yes        Need for Family Participation in Patient Care: Yes (Comment) Care giver support system in place?: Yes (comment)   Criminal Activity/Legal Involvement Pertinent to Current Situation/Hospitalization: No - Comment as needed  Activities of Daily Living      Permission Sought/Granted                  Emotional Assessment Appearance:: Appears stated age Attitude/Demeanor/Rapport: Engaged Affect (typically observed): Appropriate Orientation: : Oriented to Self, Oriented to Place, Oriented to  Time, Oriented to Situation Alcohol / Substance Use: Not Applicable Psych Involvement: No (comment)  Admission diagnosis:  Weakness  [R53.1] Acute cystitis without hematuria [N30.00] Fall, initial encounter [W19.XXXA] Altered mental status, unspecified altered mental status type [H85.27] Acute metabolic encephalopathy [P82.42] Patient Active Problem List   Diagnosis Date Noted  . Acute deep vein thrombosis (DVT) of popliteal vein of both lower extremities (HCC)   . Acute urinary retention   . Right leg swelling   . Acute metabolic encephalopathy 35/36/1443  . UTI due to extended-spectrum beta lactamase (ESBL) producing Escherichia coli 03/20/2020  . Fall 03/20/2020  . Weakness 03/20/2020  . Type 2 diabetes mellitus with hyperlipidemia ([place]) 03/20/2020  . Dementia ([place])    PCP:  [facility] Pharmacy:  No Pharmacies Listed    Social Determinants of Health (SDOH) Interventions    Readmission Risk Interventions No flowsheet data found.

## 2020-03-24 NOTE — Progress Notes (Signed)
Physical Therapy Treatment Patient Details Name: Carol Curtis MRN: [id number] DOB: May 05, 1936 Today's Date: 03/24/2020    History of Present Illness Carol Curtis is a 84 y.o. female with medical history significant for dementia, diabetes mellitus, history of left breast cancer who was brought to the emergency room via EMS after an unwitnessed fall.  Pt diagnosed with acute metabolic encephalopathy secondary to urinary tract infection. All imaging was negative for any acute abnormalities.    PT Comments    Pt presents in supine position on arrival to room. Pt asleep but awakens with verbal greeting. Pt unable to perform exercises and has rigidity when performing PROM exercises as listed below. The only exercise she is actively able to perform is squeezing her fingers to assess grip strength. Pt will benefit from skilled PT services to address deficits in strength, balance, and decrease risk for future falls.   Follow Up Recommendations  SNF     Equipment Recommendations  None recommended by PT    Recommendations for Other Services       Precautions / Restrictions Precautions Precautions: Fall Restrictions Weight Bearing Restrictions: No    Mobility  Bed Mobility               General bed mobility comments: Did not perform due to patient's rigidity and refusal  Transfers                 General transfer comment: Did not attempt this session  Ambulation/Gait             General Gait Details: Did not perform due to patient's refusal   Stairs             Wheelchair Mobility    Modified Rankin (Stroke Patients Only)       Balance Overall balance assessment: Needs assistance     Sitting balance - Comments: Did not assess this session       Standing balance comment: Did not assess this session                            Cognition Arousal/Alertness: Lethargic Behavior During Therapy: Flat affect Overall Cognitive  Status: History of cognitive impairments - at baseline                                 General Comments: Patient unable to say her name when asked how to pronounce it      Exercises General Exercises - Upper Extremity Elbow Flexion: Both;PROM;Other reps (comment);Supine (2 reps; attempted AAROM but patient rigid) Elbow Extension: PROM;Both;Supine;Other reps (comment) (2; attempted AAROM but patient rigid) General Exercises - Lower Extremity Ankle Circles/Pumps: PROM;Both;Supine;Other reps (comment) (2 reps before patient said ouch) Straight Leg Raises: Both;Supine;Other reps (comment);PROM (2 reps, attempted AAROM but patient rigid) Other Exercises Other Exercises: Pt performed 2 sets BUE of grip strength    General Comments General comments (skin integrity, edema, etc.): Patient's BLE with edema      Pertinent Vitals/Pain Pain Assessment: No/denies pain    Home Living                      Prior Function            PT Goals (current goals can now be found in the care plan section) Acute Rehab PT Goals Patient Stated Goal: "To go home" PT Goal Formulation:  Patient unable to participate in goal setting Progress towards PT goals: Not progressing toward goals - comment (Due to cognition)    Frequency    Min 2X/week      PT Plan Current plan remains appropriate    Co-evaluation              AM-PAC PT "6 Clicks" Mobility   Outcome Measure  Help needed turning from your back to your side while in a flat bed without using bedrails?: Total Help needed moving from lying on your back to sitting on the side of a flat bed without using bedrails?: Total Help needed moving to and from a bed to a chair (including a wheelchair)?: Total Help needed standing up from a chair using your arms (e.g., wheelchair or bedside chair)?: Total Help needed to walk in hospital room?: Total Help needed climbing 3-5 steps with a railing? : Total 6 Click Score: 6     End of Session Equipment Utilized During Treatment: Gait belt Activity Tolerance: Patient limited by lethargy Patient left: in bed;with call bell/phone within reach;with bed alarm set Nurse Communication: Mobility status PT Visit Diagnosis: Muscle weakness (generalized) (M62.81);Difficulty in walking, not elsewhere classified (R26.2)     Time: 1431-1440 PT Time Calculation (min) (ACUTE ONLY): 9 min  Charges:                         Noemi [place], SPT Bernita Raisin 03/24/2020, 3:29 PM

## 2020-03-24 NOTE — NC FL2 (Signed)
[place] LEVEL OF CARE SCREENING TOOL     IDENTIFICATION  Patient Name: Carol Curtis Birthdate: [birth year] Sex: female Admission Date (Current Location): 03/20/2020  [place] and [place] Number:  Engineering geologist and Address:  [facility], [address], [place], [place] [address]      Provider Number: [id number]  Attending Physician Name and Address:  Loletha Grayer, MD  Relative Name and Phone Number:  Marylene Buerger [phone]    Current Level of Care: Hospital Recommended Level of Care: [place] Prior Approval Number:    Date Approved/Denied:   PASRR Number: [id number]  Discharge Plan: SNF    Current Diagnoses: Patient Active Problem List   Diagnosis Date Noted  . Acute deep vein thrombosis (DVT) of popliteal vein of both lower extremities (HCC)   . Acute urinary retention   . Right leg swelling   . Acute metabolic encephalopathy 84/69/6295  . UTI due to extended-spectrum beta lactamase (ESBL) producing Escherichia coli 03/20/2020  . Fall 03/20/2020  . Weakness 03/20/2020  . Type 2 diabetes mellitus with hyperlipidemia ([place]) 03/20/2020  . Dementia ([place])     Orientation RESPIRATION BLADDER Height & Weight     Self  Normal Incontinent Weight: 72.6 kg Height:  5\' 4"  (162.6 cm)  BEHAVIORAL SYMPTOMS/MOOD NEUROLOGICAL BOWEL NUTRITION STATUS      Incontinent Diet (Dysphagia 1)  AMBULATORY STATUS COMMUNICATION OF NEEDS Skin   Extensive Assist Does not communicate Normal                       Personal Care Assistance Level of Assistance  Bathing, Feeding, Dressing Bathing Assistance: Maximum assistance Feeding assistance: Limited assistance Dressing Assistance: Maximum assistance     Functional Limitations Info  Sight, Hearing, Speech          SPECIAL CARE FACTORS FREQUENCY  PT (By licensed PT), OT (By licensed OT)                    Contractures Contractures Info: Not  present    Additional Factors Info  Code Status, Allergies Code Status Info: Full Allergies Info: No known allergies           Current Medications (03/24/2020):  This is the current hospital active medication list Current Facility-Administered Medications  Medication Dose Route Frequency Provider Last Rate Last Admin  . apixaban (ELIQUIS) tablet 10 mg  10 mg Oral BID Lu Duffel, RPH   10 mg at 03/24/20 2841   Followed by  . [START ON 03/29/2020] apixaban (ELIQUIS) tablet 5 mg  5 mg Oral BID Lu Duffel, RPH      . Chlorhexidine Gluconate Cloth 2 % PADS 6 each  6 each Topical Daily Loletha Grayer, MD   6 each at 03/22/20 0930  . donepezil (ARICEPT) tablet 10 mg  10 mg Oral QHS Loletha Grayer, MD   10 mg at 03/22/20 2304  . folic acid (FOLVITE) tablet 1 mg  1 mg Oral Daily Loletha Grayer, MD   1 mg at 03/24/20 3244  . insulin aspart (novoLOG) injection 0-5 Units  0-5 Units Subcutaneous QHS Loletha Grayer, MD   3 Units at 03/23/20 2254  . insulin aspart (novoLOG) injection 0-9 Units  0-9 Units Subcutaneous TID WC Loletha Grayer, MD   3 Units at 03/24/20 1241  . [START ON 03/25/2020] insulin glargine (LANTUS) injection 14 Units  14 Units Subcutaneous Daily Loletha Grayer, MD      .  meropenem (MERREM) 1 g in sodium chloride 0.9 % 100 mL IVPB  1 g Intravenous Q8H Oswald Hillock, RPH 200 mL/hr at 03/24/20 1241 1 g at 03/24/20 1241  . multivitamin with minerals tablet 1 tablet  1 tablet Oral Daily Loletha Grayer, MD   1 tablet at 03/24/20 [phone]  . ondansetron (ZOFRAN) tablet 4 mg  4 mg Oral Q6H PRN Agbata, Tochukwu, MD       Or  . ondansetron (ZOFRAN) injection 4 mg  4 mg Intravenous Q6H PRN Agbata, Tochukwu, MD      . polyethylene glycol (MIRALAX / GLYCOLAX) packet 17 g  17 g Oral Daily Loletha Grayer, MD   17 g at 03/24/20 0939  . pyridOXINE (VITAMIN B-6) tablet 50 mg  50 mg Oral Daily Loletha Grayer, MD   50 mg at 03/24/20 0937  . sodium chloride 0.9 % bolus  250 mL  250 mL Intravenous Once Wieting, Richard, MD      . traZODone (DESYREL) tablet 50 mg  50 mg Oral QHS Loletha Grayer, MD   50 mg at 03/22/20 2304     Discharge Medications: Please see discharge summary for a list of discharge medications.  Relevant Imaging Results:  Relevant Lab Results:   Additional Information SS# [id number]  Shelbie Ammons, RN

## 2020-03-24 NOTE — Care Management Important Message (Signed)
Important Message  Patient Details  Name: Carol Curtis MRN: [id number] Date of Birth: 1935/11/03   Medicare Important Message Given:  Yes  Reviewed with Marylene Buerger.  Copy of Medicare IM being sent to Betty's attention via mail by Patient Acces.     Dannette Barbara 03/24/2020, 3:00 PM

## 2020-03-24 NOTE — Plan of Care (Signed)

## 2020-03-24 NOTE — Progress Notes (Addendum)
Patient ID: Carol Curtis, female   DOB: Jan 19, 1936, 84 y.o.   MRN: [id number] [place] PROGRESS NOTE  RECIA SONS PNT:[id number] DOB: [birth year] DOA: 03/20/2020 PCP: [facility]  HPI/Subjective: Patient answers some yes or no questions.  Offers no complaints.  No pain anywhere.  Admitted with acute metabolic encephalopathy secondary to UTI.  Objective: Vitals:   03/23/20 2353 03/24/20 0827  BP: (!) 117/42 (!) 155/50  Pulse: 73 66  Resp: 18 18  Temp: 98.4 F (36.9 C) 97.7 F (36.5 C)  SpO2: 99% 99%    Intake/Output Summary (Last 24 hours) at 03/24/2020 1457 Last data filed at 03/24/2020 0431 Gross per 24 hour  Intake 350 ml  Output 400 ml  Net -50 ml   Filed Weights   03/20/20 0605  Weight: 72.6 kg    ROS: Review of Systems  Respiratory: Negative for shortness of breath.   Cardiovascular: Negative for chest pain.  Gastrointestinal: Negative for abdominal pain.  Musculoskeletal: Negative for joint pain.   Exam: Physical Exam HENT:     Head: Normocephalic.     Mouth/Throat:     Pharynx: No oropharyngeal exudate.  Eyes:     General: Lids are normal.     Conjunctiva/sclera: Conjunctivae normal.  Cardiovascular:     Rate and Rhythm: Normal rate and regular rhythm.     Heart sounds: Normal heart sounds, S1 normal and S2 normal.  Pulmonary:     Breath sounds: No decreased breath sounds, wheezing, rhonchi or rales.  Abdominal:     Palpations: Abdomen is soft.     Tenderness: There is no abdominal tenderness.  Musculoskeletal:     Right lower leg: Swelling present.     Left lower leg: Swelling present.  Skin:    General: Skin is warm.     Findings: No rash.  Neurological:     Mental Status: She is alert.     Comments: Answers some simple yes or no questions.       Data Reviewed: Basic Metabolic Panel: Recent Labs  Lab 03/20/20 0633 03/21/20 0527 03/24/20 0257  NA 133* 136 141  K 4.4 3.9 4.0  CL 94* 102 104  CO2 26 25 29    GLUCOSE 372* 141* 183*  BUN 21 10 10   CREATININE 1.00 0.75 0.67  CALCIUM 9.3 8.8* 8.6*   CBC: Recent Labs  Lab 03/20/20 0633 03/21/20 0527 03/23/20 0416 03/24/20 0257  WBC 11.1* 9.7 8.3 7.8  NEUTROABS 9.0*  --   --   --   HGB 10.8* 10.5* 9.8* 9.5*  HCT 31.9* 32.1* 30.2* 30.6*  MCV 76.1* 78.3* 77.2* 80.1  PLT 267 220 263 284    CBG: Recent Labs  Lab 03/23/20 1619 03/23/20 2044 03/24/20 0037 03/24/20 0827 03/24/20 1141  GLUCAP 141* 301* 210* 201* 244*    Recent Results (from the past 240 hour(s))  Urine Culture     Status: Abnormal   Collection Time: 03/20/20  8:09 AM   Specimen: Urine, Clean Catch  Result Value Ref Range Status   Specimen Description   Final    URINE, CLEAN CATCH Performed at [facility], [address] [place], [place] [address]    Special Requests   Final    NONE Performed at [facility], [address] [place], [place] [address]    Culture (A)  Final    >=100,000 COLONIES/mL ESCHERICHIA COLI Confirmed Extended Spectrum Beta-Lactamase Producer (ESBL).  In bloodstream infections from ESBL organisms, carbapenems are  preferred over piperacillin/tazobactam. They are shown to have a lower risk of mortality.    Report Status 03/23/2020 FINAL  Final   Organism ID, Bacteria ESCHERICHIA COLI (A)  Final      Susceptibility   Escherichia coli - MIC*    AMPICILLIN >=32 RESISTANT Resistant     CEFAZOLIN >=64 RESISTANT Resistant     CEFTRIAXONE >=64 RESISTANT Resistant     CIPROFLOXACIN >=4 RESISTANT Resistant     GENTAMICIN <=1 SENSITIVE Sensitive     IMIPENEM <=0.25 SENSITIVE Sensitive     NITROFURANTOIN <=16 SENSITIVE Sensitive     TRIMETH/SULFA >=320 RESISTANT Resistant     AMPICILLIN/SULBACTAM >=32 RESISTANT Resistant     PIP/TAZO 64 INTERMEDIATE Intermediate     * >=100,000 COLONIES/mL ESCHERICHIA COLI  Respiratory Panel by RT PCR (Flu A&B, Covid) - Nasopharyngeal Swab     Status: None   Collection Time: 03/20/20  10:21 AM   Specimen: Nasopharyngeal Swab  Result Value Ref Range Status   SARS Coronavirus 2 by RT PCR NEGATIVE NEGATIVE Final    Comment: (NOTE) SARS-CoV-2 target nucleic acids are NOT DETECTED.  The SARS-CoV-2 RNA is generally detectable in upper respiratoy specimens during the acute phase of infection. The lowest concentration of SARS-CoV-2 viral copies this assay can detect is 131 copies/mL. A negative result does not preclude SARS-Cov-2 infection and should not be used as the sole basis for treatment or other patient management decisions. A negative result may occur with  improper specimen collection/handling, submission of specimen other than nasopharyngeal swab, presence of viral mutation(s) within the areas targeted by this assay, and inadequate number of viral copies (<131 copies/mL). A negative result must be combined with clinical observations, patient history, and epidemiological information. The expected result is Negative.  Fact Sheet for Patients:  PinkCheek.be  Fact Sheet for Healthcare Providers:  GravelBags.it  This test is no t yet approved or cleared by the [place] FDA and  has been authorized for detection and/or diagnosis of SARS-CoV-2 by FDA under an Emergency Use Authorization (EUA). This EUA will remain  in effect (meaning this test can be used) for the duration of the COVID-19 declaration under Section 564(b)(1) of the Act, 21 U.S.C. section 360bbb-3(b)(1), unless the authorization is terminated or revoked sooner.     Influenza A by PCR NEGATIVE NEGATIVE Final   Influenza B by PCR NEGATIVE NEGATIVE Final    Comment: (NOTE) The Xpert Xpress SARS-CoV-2/FLU/RSV assay is intended as an aid in  the diagnosis of influenza from Nasopharyngeal swab specimens and  should not be used as a sole basis for treatment. Nasal washings and  aspirates are unacceptable for Xpert Xpress SARS-CoV-2/FLU/RSV   testing.  Fact Sheet for Patients: PinkCheek.be  Fact Sheet for Healthcare Providers: GravelBags.it  This test is not yet approved or cleared by the [place] FDA and  has been authorized for detection and/or diagnosis of SARS-CoV-2 by  FDA under an Emergency Use Authorization (EUA). This EUA will remain  in effect (meaning this test can be used) for the duration of the  Covid-19 declaration under Section 564(b)(1) of the Act, 21  U.S.C. section 360bbb-3(b)(1), unless the authorization is  terminated or revoked. Performed at [facility], [address] [place], [place] [address]      Scheduled Meds: . apixaban  10 mg Oral BID   Followed by  . [START ON 03/29/2020] apixaban  5 mg Oral BID  . Chlorhexidine Gluconate Cloth  6 each Topical Daily  .  donepezil  10 mg Oral QHS  . folic acid  1 mg Oral Daily  . insulin aspart  0-5 Units Subcutaneous QHS  . insulin aspart  0-9 Units Subcutaneous TID WC  . insulin glargine  8 Units Subcutaneous Daily  . multivitamin with minerals  1 tablet Oral Daily  . polyethylene glycol  17 g Oral Daily  . pyridOXINE  50 mg Oral Daily  . traZODone  50 mg Oral QHS   Continuous Infusions: . meropenem (MERREM) IV 1 g (03/24/20 1241)    Assessment/Plan:  1. Acute metabolic encephalopathy secondary to urinary tract infection.  Patient answering some questions.  Family also thinks mental status is better as of yesterday afternoon. 2. Acute cystitis without hematuria with ESBL E. coli infection.  Continue meropenem while here in the hospital.  Will switch over to fosfomycin upon disposition 1 dose in the hospital and 2 more doses as outpatient. 3. Extensive DVT bilateral lower extremities.  Patient did have a previous blood clot back in 2014.  Lifelong anticoagulation if able to do so.  Continue Eliquis. 4. Dementia without behavioral disturbance on Aricept. 5. Type 2 diabetes  mellitus on sliding scale insulin and increase glargine insulin to 14 units daily. 6. Weakness.  Physical therapy recommends rehab. 7. Urinary retention.  Foley catheter discontinued yesterday.  Continue to monitor closely.  May be thinning out catheterizations at rehab.     Code Status:     Code Status Orders  (From admission, onward)         Start     Ordered   03/20/20 1240  Full code  Continuous        03/20/20 1241        Code Status History    This patient has a current code status but no historical code status.   Advance Care Planning Activity     Family Communication: Spoke with Inez Catalina on the phone Disposition Plan: Status is: Inpatient  Dispo: The patient is from: Home              Anticipated d/c is to: Rehab              Anticipated d/c date is: Earliest potential be 03/25/2020 depending on rehab bed availability.  Spoke with Inez Catalina on the phone and wanted to extend search into [facility] area also.              Patient currently being treated for ESBL E. coli infection in the urine  Antibiotics:  Meropenem  Time spent: 27 minutes  [place]

## 2020-03-24 NOTE — Progress Notes (Signed)
Pt refusing to take PO meds this evening. Meds were crushed in applesauce.

## 2020-03-24 NOTE — Progress Notes (Signed)
ANTICOAGULATION CONSULT NOTE - Follow Up Consult  Pharmacy Consult for Apixaban  Indication: DVT  No Known Allergies  Patient Measurements: Height: 5\' 4"  (162.6 cm) Weight: 72.6 kg (160 lb) IBW/kg (Calculated) : 54.7  Vital Signs: Temp: 97.7 F (36.5 C) (10/04 0827) Temp Source: Oral (10/04 0827) BP: 155/50 (10/04 0827) Pulse Rate: 66 (10/04 0827)  Labs: Recent Labs    03/23/20 0416 03/24/20 0257  HGB 9.8* 9.5*  HCT 30.2* 30.6*  PLT 263 284  CREATININE  --  0.67    Estimated Creatinine Clearance: 53 mL/min (by C-G formula based on SCr of 0.67 mg/dL).   Assessment: 84 yo female admitted with Acute metabolic encephalopathy secondary to urinary tract infection. Patient also found to have swelling of right lower extremity. [place]  POSITIVE for extensive bilateral calf and femoropopliteal occlusive DVT.  Plan:  Will continue apixaban 10mg  BID x 7 days followed by apixaban 5mg  BID daily. This pharmacist attempted to call patient to counsel on Eliquis, but was unable to reach the patient. An Eliquis handout was provided to the nurse to give to [name]. Will try to contact the patient again.   Monitor CBC at least every 3 days while inpatient per protocol.   Rowland Lathe, PharmD Clinical Pharmacist 03/24/2020 2:05 PM

## 2020-03-24 NOTE — Progress Notes (Addendum)
Pt refusing to takes meds again crushed in applesauce. She will not open mouth. Marylene Buerger is POA and emergency contact. Tried to call to update on pt status, phone number not working. Will continue to try to encourage her to take eliquis for now.  I tried number again and spoke with Ms. Wooten. She encouraged [place] to play music and call her "Nana". [name] will visit tomorrow at lunch to help her eat and drink.

## 2020-03-25 ENCOUNTER — Inpatient Hospital Stay: Payer: Medicare PPO

## 2020-03-25 DIAGNOSIS — K5900 Constipation, unspecified: Secondary | ICD-10-CM

## 2020-03-25 DIAGNOSIS — N390 Urinary tract infection, site not specified: Secondary | ICD-10-CM | POA: Diagnosis not present

## 2020-03-25 DIAGNOSIS — Z7189 Other specified counseling: Secondary | ICD-10-CM | POA: Diagnosis not present

## 2020-03-25 DIAGNOSIS — Z515 Encounter for palliative care: Secondary | ICD-10-CM | POA: Diagnosis not present

## 2020-03-25 DIAGNOSIS — G9341 Metabolic encephalopathy: Secondary | ICD-10-CM | POA: Diagnosis not present

## 2020-03-25 DIAGNOSIS — I82433 Acute embolism and thrombosis of popliteal vein, bilateral: Secondary | ICD-10-CM | POA: Diagnosis not present

## 2020-03-25 DIAGNOSIS — G309 Alzheimer's disease, unspecified: Secondary | ICD-10-CM | POA: Diagnosis not present

## 2020-03-25 LAB — COMPREHENSIVE METABOLIC PANEL
ALT: 9 U/L (ref 0–44)
AST: 23 U/L (ref 15–41)
Albumin: 2.5 g/dL — ABNORMAL LOW (ref 3.5–5.0)
Alkaline Phosphatase: 84 U/L (ref 38–126)
Anion gap: 9 (ref 5–15)
BUN: 7 mg/dL — ABNORMAL LOW (ref 8–23)
CO2: 26 mmol/L (ref 22–32)
Calcium: 8.7 mg/dL — ABNORMAL LOW (ref 8.9–10.3)
Chloride: 103 mmol/L (ref 98–111)
Creatinine, Ser: 0.72 mg/dL (ref 0.44–1.00)
GFR calc Af Amer: >60 mL/min (ref >60)
GFR calc non Af Amer: >60 mL/min (ref >60)
Glucose, Bld: 188 mg/dL — ABNORMAL HIGH (ref 70–99)
Potassium: 4.5 mmol/L (ref 3.5–5.1)
Sodium: 138 mmol/L (ref 135–145)
Total Bilirubin: 0.7 mg/dL (ref 0.3–1.2)
Total Protein: 7 g/dL (ref 6.5–8.1)

## 2020-03-25 LAB — CBC
HCT: 32.1 % — ABNORMAL LOW (ref 36.0–46.0)
Hemoglobin: 10.6 g/dL — ABNORMAL LOW (ref 12.0–15.0)
MCH: 25.6 pg — ABNORMAL LOW (ref 26.0–34.0)
MCHC: 33 g/dL (ref 30.0–36.0)
MCV: 77.5 fL — ABNORMAL LOW (ref 80.0–100.0)
Platelets: 381 10*3/uL (ref 150–400)
RBC: 4.14 MIL/uL (ref 3.87–5.11)
RDW: 14.5 % (ref 11.5–15.5)
WBC: 9.6 10*3/uL (ref 4.0–10.5)
nRBC: 0 % (ref 0.0–0.2)

## 2020-03-25 LAB — GLUCOSE, CAPILLARY
Glucose-Capillary: 165 mg/dL — ABNORMAL HIGH (ref 70–99)
Glucose-Capillary: 214 mg/dL — ABNORMAL HIGH (ref 70–99)
Glucose-Capillary: 246 mg/dL — ABNORMAL HIGH (ref 70–99)
Glucose-Capillary: 241 mg/dL — ABNORMAL HIGH (ref 70–99)

## 2020-03-25 MED ORDER — FLEET ENEMA 7-19 GM/118ML RE ENEM
1.0000 | ENEMA | Freq: Every day | RECTAL | Status: DC | PRN
  Administered 2020-03-25: 1 via RECTAL

## 2020-03-25 MED ORDER — BISACODYL 10 MG RE SUPP
10.0000 mg | Freq: Once | RECTAL | Status: AC
  Administered 2020-03-25: 10 mg via RECTAL
  Filled 2020-03-25: qty 1

## 2020-03-25 MED ORDER — DOCUSATE SODIUM 100 MG PO CAPS
100.0000 mg | ORAL_CAPSULE | Freq: Two times a day (BID) | ORAL | Status: DC
  Administered 2020-03-25 – 2020-03-31 (×7): 100 mg via ORAL
  Filled 2020-03-25 (×12): qty 1

## 2020-03-25 NOTE — Progress Notes (Signed)
Patient ID: Carol Curtis, female   DOB: 09/18/1935, 84 y.o.   MRN: [id number] [place] PROGRESS NOTE  MANJOT BEUMER KKX:[id number] DOB: 02/22/1936 DOA: 03/20/2020 PCP: [facility]  HPI/Subjective: Nighttime nurse and morning nurse concerned about the patient not eating and pocketing food.  Patient's POA present for lunch and stated that she ate a good lunch.  The patient does not complain of any abdominal pain or any leg pain.  Objective: Vitals:   03/25/20 0012 03/25/20 0737  BP: (!) 146/68 (!) 180/67  Pulse: 71 69  Resp: 18 17  Temp: 98.5 F (36.9 C) 98.6 F (37 C)  SpO2: 100% 100%    Intake/Output Summary (Last 24 hours) at 03/25/2020 1441 Last data filed at 03/25/2020 0500 Gross per 24 hour  Intake 739.71 ml  Output 600 ml  Net 139.71 ml   Filed Weights   03/20/20 0605  Weight: 72.6 kg    ROS: Review of Systems  Respiratory: Negative for cough and shortness of breath.   Cardiovascular: Negative for chest pain.  Gastrointestinal: Negative for abdominal pain.  Musculoskeletal: Negative for joint pain.   Exam: Physical Exam HENT:     Mouth/Throat:     Pharynx: No oropharyngeal exudate.  Eyes:     General: Lids are normal.     Conjunctiva/sclera: Conjunctivae normal.  Cardiovascular:     Rate and Rhythm: Normal rate and regular rhythm.     Heart sounds: Normal heart sounds, S1 normal and S2 normal.  Pulmonary:     Breath sounds: Examination of the right-lower field reveals decreased breath sounds. Examination of the left-lower field reveals decreased breath sounds. Decreased breath sounds present. No wheezing, rhonchi or rales.  Abdominal:     Palpations: Abdomen is soft.     Tenderness: There is no abdominal tenderness.  Musculoskeletal:     Right lower leg: Swelling present.     Left lower leg: Swelling present.  Skin:    General: Skin is warm.     Findings: No rash.  Neurological:     Mental Status: She is alert.      Comments: Answers no to all questions.       Data Reviewed: Basic Metabolic Panel: Recent Labs  Lab 03/20/20 0633 03/21/20 0527 03/24/20 0257 03/25/20 0256  NA 133* 136 141 138  K 4.4 3.9 4.0 4.5  CL 94* 102 104 103  CO2 26 25 29 26   GLUCOSE 372* 141* 183* 188*  BUN 21 10 10  7*  CREATININE 1.00 0.75 0.67 0.72  CALCIUM 9.3 8.8* 8.6* 8.7*   Liver Function Tests: Recent Labs  Lab 03/25/20 0256  AST 23  ALT 9  ALKPHOS 84  BILITOT 0.7  PROT 7.0  ALBUMIN 2.5*   No results for input(s): LIPASE, AMYLASE in the last 168 hours. No results for input(s): AMMONIA in the last 168 hours. CBC: Recent Labs  Lab 03/20/20 0633 03/21/20 0527 03/23/20 0416 03/24/20 0257 03/25/20 0256  WBC 11.1* 9.7 8.3 7.8 9.6  NEUTROABS 9.0*  --   --   --   --   HGB 10.8* 10.5* 9.8* 9.5* 10.6*  HCT 31.9* 32.1* 30.2* 30.6* 32.1*  MCV 76.1* 78.3* 77.2* 80.1 77.5*  PLT 267 220 263 284 381    CBG: Recent Labs  Lab 03/24/20 1141 03/24/20 1649 03/24/20 2004 03/25/20 0735 03/25/20 1137  GLUCAP 244* 194* 288* 165* 214*    Recent Results (from the past 240 hour(s))  Urine Culture  Status: Abnormal   Collection Time: 03/20/20  8:09 AM   Specimen: Urine, Clean Catch  Result Value Ref Range Status   Specimen Description   Final    URINE, CLEAN CATCH Performed at [facility], [address] [place], [place] [address]    Special Requests   Final    NONE Performed at [facility], [address] [place], [place] [address]    Culture (A)  Final    >=100,000 COLONIES/mL ESCHERICHIA COLI Confirmed Extended Spectrum Beta-Lactamase Producer (ESBL).  In bloodstream infections from ESBL organisms, carbapenems are preferred over piperacillin/tazobactam. They are shown to have a lower risk of mortality.    Report Status 03/23/2020 FINAL  Final   Organism ID, Bacteria ESCHERICHIA COLI (A)  Final      Susceptibility   Escherichia coli - MIC*    AMPICILLIN >=32 RESISTANT  Resistant     CEFAZOLIN >=64 RESISTANT Resistant     CEFTRIAXONE >=64 RESISTANT Resistant     CIPROFLOXACIN >=4 RESISTANT Resistant     GENTAMICIN <=1 SENSITIVE Sensitive     IMIPENEM <=0.25 SENSITIVE Sensitive     NITROFURANTOIN <=16 SENSITIVE Sensitive     TRIMETH/SULFA >=320 RESISTANT Resistant     AMPICILLIN/SULBACTAM >=32 RESISTANT Resistant     PIP/TAZO 64 INTERMEDIATE Intermediate     * >=100,000 COLONIES/mL ESCHERICHIA COLI  Respiratory Panel by RT PCR (Flu A&B, Covid) - Nasopharyngeal Swab     Status: None   Collection Time: 03/20/20 10:21 AM   Specimen: Nasopharyngeal Swab  Result Value Ref Range Status   SARS Coronavirus 2 by RT PCR NEGATIVE NEGATIVE Final    Comment: (NOTE) SARS-CoV-2 target nucleic acids are NOT DETECTED.  The SARS-CoV-2 RNA is generally detectable in upper respiratoy specimens during the acute phase of infection. The lowest concentration of SARS-CoV-2 viral copies this assay can detect is 131 copies/mL. A negative result does not preclude SARS-Cov-2 infection and should not be used as the sole basis for treatment or other patient management decisions. A negative result may occur with  improper specimen collection/handling, submission of specimen other than nasopharyngeal swab, presence of viral mutation(s) within the areas targeted by this assay, and inadequate number of viral copies (<131 copies/mL). A negative result must be combined with clinical observations, patient history, and epidemiological information. The expected result is Negative.  Fact Sheet for Patients:  PinkCheek.be  Fact Sheet for Healthcare Providers:  GravelBags.it  This test is no t yet approved or cleared by the [place] FDA and  has been authorized for detection and/or diagnosis of SARS-CoV-2 by FDA under an Emergency Use Authorization (EUA). This EUA will remain  in effect (meaning this test can be used) for  the duration of the COVID-19 declaration under Section 564(b)(1) of the Act, 21 U.S.C. section 360bbb-3(b)(1), unless the authorization is terminated or revoked sooner.     Influenza A by PCR NEGATIVE NEGATIVE Final   Influenza B by PCR NEGATIVE NEGATIVE Final    Comment: (NOTE) The Xpert Xpress SARS-CoV-2/FLU/RSV assay is intended as an aid in  the diagnosis of influenza from Nasopharyngeal swab specimens and  should not be used as a sole basis for treatment. Nasal washings and  aspirates are unacceptable for Xpert Xpress SARS-CoV-2/FLU/RSV  testing.  Fact Sheet for Patients: PinkCheek.be  Fact Sheet for Healthcare Providers: GravelBags.it  This test is not yet approved or cleared by the [place] FDA and  has been authorized for detection and/or diagnosis of SARS-CoV-2 by  FDA under an Emergency Use Authorization (EUA). This EUA will remain  in effect (meaning this test can be used) for the duration of the  Covid-19 declaration under Section 564(b)(1) of the Act, 21  U.S.C. section 360bbb-3(b)(1), unless the authorization is  terminated or revoked. Performed at [facility], [address], [place], [place] [address]      Studies: CT RENAL STONE STUDY  Result Date: 03/25/2020 CLINICAL DATA:  Acute cystitis without hematuria. Urinary retention. Kidney stone suspected. EXAM: CT ABDOMEN AND PELVIS WITHOUT CONTRAST TECHNIQUE: Multidetector CT imaging of the abdomen and pelvis was performed following the standard protocol without IV contrast. COMPARISON:  None. FINDINGS: Lower chest: Minimal dependent atelectasis. Aortic atherosclerotic calcification. Coronary artery calcification. Hepatobiliary: Liver appears normal without contrast. Small calcified stones in the gallbladder neck without CT evidence cholecystitis or obstruction. Pancreas: Normal Spleen: Normal Adrenals/Urinary Tract: Adrenal glands are normal.  Left kidney is normal. Right kidney shows a few small parenchymal calcifications at the lower pole adjacent to a small focus of atrophy. No evidence stone in the collecting system. No hydroureteronephrosis on either side. No gross bladder abnormality visible. Some air in the bladder presumed secondary to catheterization. If not, this could be secondary to infectious cystitis. Stomach/Bowel: Stomach and small intestine are unremarkable. Moderate amount of fecal matter without evidence of primary colon pathology. Vascular/Lymphatic: Aortic atherosclerosis without aneurysm. IVC filter in place. Thrombus present within the distal IVC and iliac veins. Reproductive: Previous hysterectomy.  No pelvic mass. Other: No free fluid or air. Musculoskeletal: Chronic spinal degenerative changes. No acute bone finding. IMPRESSION: 1. Some air in the bladder presumed secondary to catheterization. If not, this could be secondary to infectious cystitis. 2. No evidence of urinary tract stone disease or other acute urinary tract pathology. Small parenchymal calcifications in the lower pole the right kidney adjacent to a region of atrophy. 3. IVC filter in place. Thrombus present within the distal IVC and iliac veins. 4. Aortic atherosclerosis. Coronary artery calcification. 5. Small calcified stones in the gallbladder neck without CT evidence of cholecystitis or obstruction. Aortic Atherosclerosis (ICD10-I70.0). Electronically Signed   By: Nelson Chimes M.D.   On: 03/25/2020 08:54    Scheduled Meds: . apixaban  10 mg Oral BID   Followed by  . [START ON 03/29/2020] apixaban  5 mg Oral BID  . bisacodyl  10 mg Rectal Once  . Chlorhexidine Gluconate Cloth  6 each Topical Daily  . docusate sodium  100 mg Oral BID  . donepezil  10 mg Oral QHS  . folic acid  1 mg Oral Daily  . insulin aspart  0-5 Units Subcutaneous QHS  . insulin aspart  0-9 Units Subcutaneous TID WC  . insulin glargine  14 Units Subcutaneous Daily  . multivitamin  with minerals  1 tablet Oral Daily  . polyethylene glycol  17 g Oral Daily  . pyridOXINE  50 mg Oral Daily  . traZODone  50 mg Oral QHS   Continuous Infusions: . meropenem (MERREM) IV 1 g (03/25/20 1914)    Assessment/Plan:  1. ESBL E. coli infection.  Initially was on meropenem and then switched over to Zosyn but culture resistant to Zosyn and switch back to meropenem.  Will switch over to fosfomycin for 1 dose upon disposition and 2 more doses as outpatient. 2. Extensive DVT bilateral lower extremities.  Patient did have a previous blood clot in IVC filter back in 2014.  Lifelong anticoagulation needed.  Continue Eliquis. 3. Acute metabolic encephalopathy secondary  to urinary tract infection and underlying dementia.  Mental status improved from when she came in. 4. Dementia without behavioral disturbance on Aricept. 5. Type 2 diabetes mellitus on sliding scale insulin.  Increase glargine insulin to 14 units daily. 6. Weakness.  Physical therapy recommends rehab 7. Urinary retention.  In and out catheterization every 8 hours as needed 8. Constipation will give Dulcolax suppository as needed Fleet enema.  Colace. 9. Palliative care consultation.  Nursing staff reports that difficulty feeding her and giving meds.  POA states that she did eat a good lunch.     Code Status:     Code Status Orders  (From admission, onward)         Start     Ordered   03/20/20 1240  Full code  Continuous        03/20/20 1241        Code Status History    This patient has a current code status but no historical code status.   Advance Care Planning Activity     Family Communication: Spoke with Sanda Klein on the phone Disposition Plan: Status is: Inpatient  Dispo: The patient is from: Home              Anticipated d/c is to: Rehab              Anticipated d/c date is: Family would like a rehab bed closer to where they live.  Earliest potential now would be 03/26/2020              Patient  currently receiving IV meropenem while here.  Palliative care consultation for concerns with feeding and taking medications.  Consultants:  Palliative care  Antibiotics:  IV meropenem  Time spent: 27 minutes  [place]

## 2020-03-25 NOTE — Consult Note (Addendum)
Consultation Note Date: 03/25/2020   Patient Name: Carol Curtis  DOB: 01/09/1936  MRN: [id number]  Age / Sex: 84 y.o., female  PCP: [facility] Referring Physician: Loletha Grayer, MD  Reason for Consultation: Establishing goals of care  HPI/Patient Profile: Carol Curtis is a 84 y.o. female with medical history significant for dementia, diabetes mellitus, history of left breast cancer who was brought to the emergency room via EMS after an unwitnessed fall.   Clinical Assessment and Goals of Care: Patient is resting in bed with HPOA Marylene Buerger at bedside. She states [name] is married, but estranged from her husband. She never had children. She states she met her around 20 years ago when [name] a biology teacher and [name] was a vice principal. She states patient has completed HPOA and living will papers. She states she will bring these documents when she returns Thursday.   In 2018 patient moved into a facility to receive help with medications. In 2019 she moved to an ALF/memory care facility. She states because of covid, she has not been able to visit since June. She states her current baseline involves "walking and sundowning". She states when talking with her she will answer questions with "yes, good, or okay" usually.   We discussed her diagnosis, prognosis, GOC, and options.  A detailed discussion was had today regarding advanced directives.  Concepts specific to code status, artifical feeding and hydration, IV antibiotics and rehospitalization were discussed.  The difference between an aggressive medical intervention path and a comfort care path was discussed.  Values and goals of care important to patient and family were attempted to be elicited.  [name] states Mrs. Swallows would want to do what she could to live as long as possible including resusitation.  Discussed limitations of medical interventions to prolong quality of life in some situations and discussed the concept of human mortality. Inquired as to what personal limits patient may have set to her care. She states patient would not want to live in a vegetative state.   Primary MD called Ms. Wooten to update. Discussed status including poor oral intake, and bilateral DVT's with IVC filter remaining in place. [name] states patient has eaten well while she has been present today, and advises staff said she ate well this morning. She declined water offered by Ms. Wooten at the time of my visit.  Discussed that if overall oral nutrition and hydration status is determined to be insufficient, there may be a possible question of a feeding tube in the future; we discussed how this would not be recommended due to her dementia, and the ease in which it can be self removed. Discussed the progressive nature of her dementia.  She states she will look at [name]'s living will and determine care moving forward. Recommend palliative on D/C.      SUMMARY OF RECOMMENDATIONS   Full code/full scope. [name] to bring HPOA papers and living will.    Recommend palliative at D/C.  Prognosis:   Poor overall.       Primary Diagnoses: Present on Admission: . Acute metabolic encephalopathy . Fall . Dementia ([place])   I have reviewed the medical record, interviewed the patient and family, and examined the patient. The following aspects are pertinent.  Past Medical History:  Diagnosis Date  . Breast cancer ([place])    left breast ca  . Dementia ([place])   . Diabetes mellitus without complication [facility])    Social History   Socioeconomic History  . Marital status: Married    Spouse name: Not on file  . Number of children: Not on file  . Years of education: Not on file  . Highest education level: Not on file  Occupational History  . Not on file  Tobacco Use  . Smoking status: Unknown If Ever  Smoked  Substance and Sexual Activity  . Alcohol use: Never  . Drug use: Never  . Sexual activity: Not on file  Other Topics Concern  . Not on file  Social History Narrative  . Not on file   Social Determinants of Health   Financial Resource Strain:   . Difficulty of Paying Living Expenses: Not on file  Food Insecurity:   . Worried About Charity fundraiser in the Last Year: Not on file  . Ran Out of Food in the Last Year: Not on file  Transportation Needs:   . Lack of Transportation (Medical): Not on file  . Lack of Transportation (Non-Medical): Not on file  Physical Activity:   . Days of Exercise per Week: Not on file  . Minutes of Exercise per Session: Not on file  Stress:   . Feeling of Stress : Not on file  Social Connections:   . Frequency of Communication with Friends and Family: Not on file  . Frequency of Social Gatherings with Friends and Family: Not on file  . Attends Religious Services: Not on file  . Active Member of Clubs or Organizations: Not on file  . Attends Archivist Meetings: Not on file  . Marital Status: Not on file   History reviewed. No pertinent family history. Scheduled Meds: . apixaban  10 mg Oral BID   Followed by  . [START ON 03/29/2020] apixaban  5 mg Oral BID  . bisacodyl  10 mg Rectal Once  . Chlorhexidine Gluconate Cloth  6 each Topical Daily  . docusate sodium  100 mg Oral BID  . donepezil  10 mg Oral QHS  . folic acid  1 mg Oral Daily  . insulin aspart  0-5 Units Subcutaneous QHS  . insulin aspart  0-9 Units Subcutaneous TID WC  . insulin glargine  14 Units Subcutaneous Daily  . multivitamin with minerals  1 tablet Oral Daily  . polyethylene glycol  17 g Oral Daily  . pyridOXINE  50 mg Oral Daily  . traZODone  50 mg Oral QHS   Continuous Infusions: . meropenem (MERREM) IV 1 g (03/25/20 0649)   PRN Meds:.ondansetron **OR** ondansetron (ZOFRAN) IV, sodium phosphate Medications Prior to Admission:  Prior to Admission  medications   Medication Sig Start Date End Date Taking? Authorizing Provider  amLODipine (NORVASC) 5 MG tablet Take 5 mg by mouth daily.   Yes [provider]  aspirin 81 MG chewable tablet Chew 81 mg by mouth daily.   Yes [provider]  atorvastatin (LIPITOR) 20 MG tablet Take 20 mg by mouth daily.   Yes [provider]  B Complex-C (B-COMPLEX  WITH VITAMIN C) tablet Take 1 tablet by mouth daily.   Yes [provider]  Calcium-Magnesium-Zinc 334-134-5 MG TABS Take 1 tablet by mouth every evening.   Yes [provider]  donepezil (ARICEPT) 10 MG tablet Take 10 mg by mouth at bedtime.   Yes [provider]  folic acid (FOLVITE) 1 MG tablet Take 1 mg by mouth daily.   Yes [provider]  furosemide (LASIX) 20 MG tablet Take 20 mg by mouth.   Yes [provider]  insulin lispro protamine-lispro (HUMALOG 75/25 MIX) (75-25) 100 UNIT/ML SUSP injection Inject 10-15 Units into the skin 2 (two) times daily with a meal.   Yes [provider]  metFORMIN (GLUCOPHAGE) 1000 MG tablet Take 1,000 mg by mouth 2 (two) times daily with a meal.   Yes [provider]  Multiple Vitamins-Minerals (CENTRUM SILVER 50+WOMEN) TABS Take 1 tablet by mouth daily.   Yes [provider]  Multiple Vitamins-Minerals (CERTAGEN PO) Take 1 tablet by mouth daily.   Yes [provider]  pyridOXINE (VITAMIN B-6) 50 MG tablet Take 50 mg by mouth daily.   Yes [provider]  traZODone (DESYREL) 50 MG tablet Take 50 mg by mouth at bedtime.   Yes [provider]  furosemide (LASIX) 40 MG tablet Take 40 mg by mouth.    [provider]   No Known Allergies Review of Systems  Unable to perform ROS   Physical Exam Pulmonary:     Effort: Pulmonary effort is normal.  Neurological:     Mental Status: She is alert.     Vital Signs: BP (!) 180/67 (BP Location: Left Arm)   Pulse 69   Temp 98.6 F (37  C) (Oral)   Resp 17   Ht 5' 4" (1.626 m)   Wt 72.6 kg   SpO2 100%   BMI 27.46 kg/m  Pain Scale: 0-10   Pain Score: Asleep   SpO2: SpO2: 100 % O2 Device:SpO2: 100 % O2 Flow Rate: .   IO: Intake/output summary:   Intake/Output Summary (Last 24 hours) at 03/25/2020 1501 Last data filed at 03/25/2020 0500 Gross per 24 hour  Intake 739.71 ml  Output 600 ml  Net 139.71 ml    LBM: Last BM Date: 03/23/20 Baseline Weight: Weight: 72.6 kg Most recent weight: Weight: 72.6 kg     Palliative Assessment/Data:     Time In: 2:30 Time Out: 3:20 Time Total: 50 min Greater than 50%  of this time was spent counseling and coordinating care related to the above assessment and plan.  Signed by: Asencion Gowda, NP   Please contact Palliative Medicine Team phone at [phone] for questions and concerns.  For individual provider: See Shea Evans

## 2020-03-25 NOTE — Plan of Care (Signed)
Pt continues to retain urine. Straight cath ordered to relieve 637ml from bladder at 0500. Pt tolerated well. Problem: Education: Goal: Knowledge of General Education information will improve Description: Including pain rating scale, medication(s)/side effects and non-pharmacologic comfort measures Outcome: Progressing   Problem: Health Behavior/Discharge Planning: Goal: Ability to manage health-related needs will improve Outcome: Progressing   Problem: Clinical Measurements: Goal: Ability to maintain clinical measurements within normal limits will improve Outcome: Progressing Goal: Will remain free from infection Outcome: Progressing Goal: Diagnostic test results will improve Outcome: Progressing Goal: Respiratory complications will improve Outcome: Progressing Goal: Cardiovascular complication will be avoided Outcome: Progressing   Problem: Activity: Goal: Risk for activity intolerance will decrease Outcome: Progressing   Problem: Nutrition: Goal: Adequate nutrition will be maintained Outcome: Progressing   Problem: Coping: Goal: Level of anxiety will decrease Outcome: Progressing   Problem: Elimination: Goal: Will not experience complications related to bowel motility Outcome: Progressing Goal: Will not experience complications related to urinary retention Outcome: Progressing   Problem: Pain Managment: Goal: General experience of comfort will improve Outcome: Progressing   Problem: Safety: Goal: Ability to remain free from injury will improve Outcome: Progressing   Problem: Skin Integrity: Goal: Risk for impaired skin integrity will decrease Outcome: Progressing

## 2020-03-25 NOTE — Plan of Care (Signed)
Patient requires feeding and encouragement.  D/T AMS reports indicate that she's not had much intake and even has been refusing medications (even if crushed and offered in ice cream or apple sauce. - it's often pocketed or spit right back out.   Music has been started in her room - per family suggestion.  But, we've found it's often a moment that has to be found, rather than the music itself.  Patient may be alert and open her eyes, but when she says "NO" - there's no changing her mind:)  However, today has been a little better for intake.  She did finish about 1/2 her breakfast and 3/4 her tray with her family member.   Dr. Darlyn Chamber.

## 2020-03-26 DIAGNOSIS — N390 Urinary tract infection, site not specified: Secondary | ICD-10-CM | POA: Diagnosis not present

## 2020-03-26 DIAGNOSIS — R338 Other retention of urine: Secondary | ICD-10-CM | POA: Diagnosis not present

## 2020-03-26 DIAGNOSIS — G309 Alzheimer's disease, unspecified: Secondary | ICD-10-CM | POA: Diagnosis not present

## 2020-03-26 DIAGNOSIS — G9341 Metabolic encephalopathy: Secondary | ICD-10-CM | POA: Diagnosis not present

## 2020-03-26 LAB — CBC
HCT: 32.9 % — ABNORMAL LOW (ref 36.0–46.0)
Hemoglobin: 10.3 g/dL — ABNORMAL LOW (ref 12.0–15.0)
MCH: 24.9 pg — ABNORMAL LOW (ref 26.0–34.0)
MCHC: 31.3 g/dL (ref 30.0–36.0)
MCV: 79.5 fL — ABNORMAL LOW (ref 80.0–100.0)
Platelets: 348 10*3/uL (ref 150–400)
RBC: 4.14 MIL/uL (ref 3.87–5.11)
RDW: 14.6 % (ref 11.5–15.5)
WBC: 10.5 10*3/uL (ref 4.0–10.5)
nRBC: 0 % (ref 0.0–0.2)

## 2020-03-26 LAB — BASIC METABOLIC PANEL
Anion gap: 9 (ref 5–15)
BUN: 12 mg/dL (ref 8–23)
CO2: 25 mmol/L (ref 22–32)
Calcium: 8.9 mg/dL (ref 8.9–10.3)
Chloride: 102 mmol/L (ref 98–111)
Creatinine, Ser: 0.8 mg/dL (ref 0.44–1.00)
GFR calc non Af Amer: >60 mL/min (ref >60)
Glucose, Bld: 217 mg/dL — ABNORMAL HIGH (ref 70–99)
Potassium: 4.1 mmol/L (ref 3.5–5.1)
Sodium: 136 mmol/L (ref 135–145)

## 2020-03-26 LAB — GLUCOSE, CAPILLARY
Glucose-Capillary: 192 mg/dL — ABNORMAL HIGH (ref 70–99)
Glucose-Capillary: 237 mg/dL — ABNORMAL HIGH (ref 70–99)
Glucose-Capillary: 261 mg/dL — ABNORMAL HIGH (ref 70–99)
Glucose-Capillary: 243 mg/dL — ABNORMAL HIGH (ref 70–99)

## 2020-03-26 MED ORDER — SODIUM CHLORIDE 0.9 % IV SOLN
INTRAVENOUS | Status: DC | PRN
  Administered 2020-03-26: 500 mL via INTRAVENOUS

## 2020-03-26 MED ORDER — SODIUM CHLORIDE 0.9 % IV SOLN
1.0000 g | Freq: Three times a day (TID) | INTRAVENOUS | Status: AC
  Administered 2020-03-26 – 2020-03-27 (×3): 1 g via INTRAVENOUS
  Filled 2020-03-26 (×4): qty 1

## 2020-03-26 MED ORDER — INSULIN ASPART 100 UNIT/ML ~~LOC~~ SOLN
3.0000 [IU] | Freq: Three times a day (TID) | SUBCUTANEOUS | Status: DC
  Administered 2020-03-26 – 2020-04-01 (×14): 3 [IU] via SUBCUTANEOUS
  Filled 2020-03-26 (×14): qty 1

## 2020-03-26 MED ORDER — TAMSULOSIN HCL 0.4 MG PO CAPS
0.4000 mg | ORAL_CAPSULE | Freq: Every day | ORAL | Status: DC
  Administered 2020-03-26 – 2020-03-30 (×4): 0.4 mg via ORAL
  Filled 2020-03-26 (×7): qty 1

## 2020-03-26 NOTE — Progress Notes (Signed)
Inpatient Diabetes Program Recommendations  AACE/ADA: New Consensus Statement on Inpatient Glycemic Control   Target Ranges:  Prepandial:   less than 140 mg/dL      Peak postprandial:   less than 180 mg/dL (1-2 hours)      Critically ill patients:  140 - 180 mg/dL   Results for Carol Curtis, Carol Curtis (MRN [id number]) as of 03/26/2020 10:25  Ref. Range 03/25/2020 07:35 03/25/2020 11:37 03/25/2020 15:45 03/25/2020 21:54 03/26/2020 07:21  Glucose-Capillary Latest Ref Range: 70 - 99 mg/dL 165 (H) 214 (H) 246 (H) 241 (H) 192 (H)   Review of Glycemic Control  Diabetes history: DM2 Outpatient Diabetes medications: 75/25 10-15 units BID, Metformin 1000 mg BID Current orders for Inpatient glycemic control: Lantus 14 units daily, Novolog 0-9 units TID with meals, Novolog 0-5 units QHS  Inpatient Diabetes Program Recommendations:    Insulin: Please consider ordering Novolog 3 units TID with meals for meal coverage if patient eats at least 50% of meals.  Thanks, Barnie Alderman, RN, MSN, CDE Diabetes Coordinator Inpatient Diabetes Program [phone] (Team Pager from 8am to 5pm)

## 2020-03-26 NOTE — Progress Notes (Signed)
PROGRESS NOTE    Carol Curtis  CZY:[id number] DOB: 1935/12/03 DOA: 03/20/2020 PCP: Orvis Brill, [facility]   Chief complaint: fall Brief Narrative:  Patient is 84 year old female with history of dementia, type 2 diabetes, history of left breast cancer who was brought to the emergency room after an unwitnessed fall.  She is placed on meropenem for urinary tract infection with E. coli ESBL.   Assessment & Plan:   Principal Problem:   Acute metabolic encephalopathy Active Problems:   Dementia ([place])   UTI due to extended-spectrum beta lactamase (ESBL) producing Escherichia coli   Fall   Weakness   Type 2 diabetes mellitus with hyperlipidemia (HCC)   Right leg swelling   Acute deep vein thrombosis (DVT) of popliteal vein of both lower extremities (HCC)   Acute urinary retention   Constipation  #1.  Acute metabolic encephalopathy can [place] to UTI.  With baseline dementia. Patient currently still confused, but no agitation.  #2.  E. coli ESBL urinary tract infection. Continue meropenem for another day.  Patient will be finishing 6 days of meropenem by tomorrow, patient does not have any pyelonephritis.  Will not need additional antibiotics after that.  #3. Lower extensive bilateral lower extremity DVT. Continue Eliquis.  #4.  Dementia with behavioral disturbance. Continue Aricept.  5.  Type 2 diabetes uncontrolled with hyperglycemia. Continue current regimen with Lantus and sliding scale insulin.  6.  Urinary retention. Continue I&O catheter and bladder scan.  I will also start Flomax.  7.  Constipation.  Continue as needed stool softeners    DVT prophylaxis: Eliquis Code Status: Discussed with patient POA, patient currently still full code.  Due to patient medical conditions, she will reconsider and get back to [place] tomorrow. Family Communication: As above.  .   Status is: Inpatient  Remains inpatient appropriate because:Inpatient level of care appropriate due to  severity of illness .  Still requiring IV meropenem.  Dispo: The patient is from: SNF              Anticipated d/c is to: SNF              Anticipated d/c date is: 1 day              Patient currently is not medically stable to d/c.        I/O last 3 completed shifts: In: 0  Out: 600 [Urine:600] No intake/output data recorded.     Consultants:   None  Procedures: None  Antimicrobials:  Meropenem.  Subjective: Patient is very confused today.  But no agitation. Spoke with the nurse, patient has urinary tension with residual over 400 mL, she was catheterized.  She has no hematuria. She does not have any fever or chills. She has no short of breath or cough. She denies any abdominal pain or nausea vomiting.  Objective: Vitals:   03/25/20 0737 03/25/20 1543 03/25/20 2326 03/26/20 0726  BP: (!) 180/67 (!) 132/54 114/60 (!) 132/38  Pulse: 69 75 85 67  Resp: 17 18 15 18   Temp: 98.6 F (37 C) (!) 97.4 F (36.3 C) 98.4 F (36.9 C) 98.7 F (37.1 C)  TempSrc: Oral Oral Oral Oral  SpO2: 100% 99% 100% 98%  Weight:      Height:       No intake or output data in the 24 hours ending 03/26/20 1218 Filed Weights   03/20/20 0605  Weight: 72.6 kg    Examination:  General exam: Appears calm and comfortable  Respiratory system: Clear to auscultation. Respiratory effort normal. Cardiovascular system: S1 & S2 heard, RRR. No JVD, murmurs, rubs, gallops or clicks. No pedal edema. Gastrointestinal system: Abdomen is nondistended, soft and nontender. No organomegaly or masses felt. Normal bowel sounds heard. Central nervous system: Alert and oriented x1. No focal neurological deficits. Extremities: Symmetric  Skin: No rashes, lesions or ulcers     Data Reviewed: I have personally reviewed following labs and imaging studies  CBC: Recent Labs  Lab 03/20/20 0633 03/20/20 5427 03/21/20 0527 03/23/20 0416 03/24/20 0257 03/25/20 0256 03/26/20 0252  WBC 11.1*   < > 9.7  8.3 7.8 9.6 10.5  NEUTROABS 9.0*  --   --   --   --   --   --   HGB 10.8*   < > 10.5* 9.8* 9.5* 10.6* 10.3*  HCT 31.9*   < > 32.1* 30.2* 30.6* 32.1* 32.9*  MCV 76.1*   < > 78.3* 77.2* 80.1 77.5* 79.5*  PLT 267   < > 220 263 284 381 348   < > = values in this interval not displayed.   Basic Metabolic Panel: Recent Labs  Lab 03/20/20 0633 03/21/20 0527 03/24/20 0257 03/25/20 0256 03/26/20 0252  NA 133* 136 141 138 136  K 4.4 3.9 4.0 4.5 4.1  CL 94* 102 104 103 102  CO2 26 25 29 26 25   GLUCOSE 372* 141* 183* 188* 217*  BUN 21 10 10  7* 12  CREATININE 1.00 0.75 0.67 0.72 0.80  CALCIUM 9.3 8.8* 8.6* 8.7* 8.9   GFR: Estimated Creatinine Clearance: 53 mL/min (by C-G formula based on SCr of 0.8 mg/dL). Liver Function Tests: Recent Labs  Lab 03/25/20 0256  AST 23  ALT 9  ALKPHOS 84  BILITOT 0.7  PROT 7.0  ALBUMIN 2.5*   No results for input(s): LIPASE, AMYLASE in the last 168 hours. No results for input(s): AMMONIA in the last 168 hours. Coagulation Profile: No results for input(s): INR, PROTIME in the last 168 hours. Cardiac Enzymes: No results for input(s): CKTOTAL, CKMB, CKMBINDEX, TROPONINI in the last 168 hours. BNP (last 3 results) No results for input(s): PROBNP in the last 8760 hours. HbA1C: No results for input(s): HGBA1C in the last 72 hours. CBG: Recent Labs  Lab 03/25/20 1137 03/25/20 1545 03/25/20 2154 03/26/20 0721 03/26/20 1130  GLUCAP 214* 246* 241* 192* 237*   Lipid Profile: No results for input(s): CHOL, HDL, LDLCALC, TRIG, CHOLHDL, LDLDIRECT in the last 72 hours. Thyroid Function Tests: No results for input(s): TSH, T4TOTAL, FREET4, T3FREE, THYROIDAB in the last 72 hours. Anemia Panel: No results for input(s): VITAMINB12, FOLATE, FERRITIN, TIBC, IRON, RETICCTPCT in the last 72 hours. Sepsis Labs: No results for input(s): PROCALCITON, LATICACIDVEN in the last 168 hours.  Recent Results (from the past 240 hour(s))  Urine Culture     Status:  Abnormal   Collection Time: 03/20/20  8:09 AM   Specimen: Urine, Clean Catch  Result Value Ref Range Status   Specimen Description   Final    URINE, CLEAN CATCH Performed at [facility], [address], [place], [place] [address]    Special Requests   Final    NONE Performed at [facility], [address] [place],  [address]    Culture (A)  Final    >=100,000 COLONIES/mL ESCHERICHIA COLI Confirmed Extended Spectrum Beta-Lactamase Producer (ESBL).  In bloodstream infections from ESBL organisms, carbapenems are preferred over piperacillin/tazobactam. They are shown to have a lower risk  of mortality.    Report Status 03/23/2020 FINAL  Final   Organism ID, Bacteria ESCHERICHIA COLI (A)  Final      Susceptibility   Escherichia coli - MIC*    AMPICILLIN >=32 RESISTANT Resistant     CEFAZOLIN >=64 RESISTANT Resistant     CEFTRIAXONE >=64 RESISTANT Resistant     CIPROFLOXACIN >=4 RESISTANT Resistant     GENTAMICIN <=1 SENSITIVE Sensitive     IMIPENEM <=0.25 SENSITIVE Sensitive     NITROFURANTOIN <=16 SENSITIVE Sensitive     TRIMETH/SULFA >=320 RESISTANT Resistant     AMPICILLIN/SULBACTAM >=32 RESISTANT Resistant     PIP/TAZO 64 INTERMEDIATE Intermediate     * >=100,000 COLONIES/mL ESCHERICHIA COLI  Respiratory Panel by RT PCR (Flu A&B, Covid) - Nasopharyngeal Swab     Status: None   Collection Time: 03/20/20 10:21 AM   Specimen: Nasopharyngeal Swab  Result Value Ref Range Status   SARS Coronavirus 2 by RT PCR NEGATIVE NEGATIVE Final    Comment: (NOTE) SARS-CoV-2 target nucleic acids are NOT DETECTED.  The SARS-CoV-2 RNA is generally detectable in upper respiratoy specimens during the acute phase of infection. The lowest concentration of SARS-CoV-2 viral copies this assay can detect is 131 copies/mL. A negative result does not preclude SARS-Cov-2 infection and should not be used as the sole basis for treatment or other patient management decisions. A  negative result may occur with  improper specimen collection/handling, submission of specimen other than nasopharyngeal swab, presence of viral mutation(s) within the areas targeted by this assay, and inadequate number of viral copies (<131 copies/mL). A negative result must be combined with clinical observations, patient history, and epidemiological information. The expected result is Negative.  Fact Sheet for Patients:  PinkCheek.be  Fact Sheet for Healthcare Providers:  GravelBags.it  This test is no t yet approved or cleared by the [place] FDA and  has been authorized for detection and/or diagnosis of SARS-CoV-2 by FDA under an Emergency Use Authorization (EUA). This EUA will remain  in effect (meaning this test can be used) for the duration of the COVID-19 declaration under Section 564(b)(1) of the Act, 21 U.S.C. section 360bbb-3(b)(1), unless the authorization is terminated or revoked sooner.     Influenza A by PCR NEGATIVE NEGATIVE Final   Influenza B by PCR NEGATIVE NEGATIVE Final    Comment: (NOTE) The Xpert Xpress SARS-CoV-2/FLU/RSV assay is intended as an aid in  the diagnosis of influenza from Nasopharyngeal swab specimens and  should not be used as a sole basis for treatment. Nasal washings and  aspirates are unacceptable for Xpert Xpress SARS-CoV-2/FLU/RSV  testing.  Fact Sheet for Patients: PinkCheek.be  Fact Sheet for Healthcare Providers: GravelBags.it  This test is not yet approved or cleared by the [place] FDA and  has been authorized for detection and/or diagnosis of SARS-CoV-2 by  FDA under an Emergency Use Authorization (EUA). This EUA will remain  in effect (meaning this test can be used) for the duration of the  Covid-19 declaration under Section 564(b)(1) of the Act, 21  U.S.C. section 360bbb-3(b)(1), unless the authorization  is  terminated or revoked. Performed at [facility], [address] [place], [place] [address]          Radiology Studies: CT RENAL STONE STUDY  Result Date: 03/25/2020 CLINICAL DATA:  Acute cystitis without hematuria. Urinary retention. Kidney stone suspected. EXAM: CT ABDOMEN AND PELVIS WITHOUT CONTRAST TECHNIQUE: Multidetector CT imaging of the abdomen and pelvis was performed following the standard  protocol without IV contrast. COMPARISON:  None. FINDINGS: Lower chest: Minimal dependent atelectasis. Aortic atherosclerotic calcification. Coronary artery calcification. Hepatobiliary: Liver appears normal without contrast. Small calcified stones in the gallbladder neck without CT evidence cholecystitis or obstruction. Pancreas: Normal Spleen: Normal Adrenals/Urinary Tract: Adrenal glands are normal. Left kidney is normal. Right kidney shows a few small parenchymal calcifications at the lower pole adjacent to a small focus of atrophy. No evidence stone in the collecting system. No hydroureteronephrosis on either side. No gross bladder abnormality visible. Some air in the bladder presumed secondary to catheterization. If not, this could be secondary to infectious cystitis. Stomach/Bowel: Stomach and small intestine are unremarkable. Moderate amount of fecal matter without evidence of primary colon pathology. Vascular/Lymphatic: Aortic atherosclerosis without aneurysm. IVC filter in place. Thrombus present within the distal IVC and iliac veins. Reproductive: Previous hysterectomy.  No pelvic mass. Other: No free fluid or air. Musculoskeletal: Chronic spinal degenerative changes. No acute bone finding. IMPRESSION: 1. Some air in the bladder presumed secondary to catheterization. If not, this could be secondary to infectious cystitis. 2. No evidence of urinary tract stone disease or other acute urinary tract pathology. Small parenchymal calcifications in the lower pole the right kidney adjacent  to a region of atrophy. 3. IVC filter in place. Thrombus present within the distal IVC and iliac veins. 4. Aortic atherosclerosis. Coronary artery calcification. 5. Small calcified stones in the gallbladder neck without CT evidence of cholecystitis or obstruction. Aortic Atherosclerosis (ICD10-I70.0). Electronically Signed   By: Nelson Chimes M.D.   On: 03/25/2020 08:54        Scheduled Meds: . apixaban  10 mg Oral BID   Followed by  . [START ON 03/29/2020] apixaban  5 mg Oral BID  . Chlorhexidine Gluconate Cloth  6 each Topical Daily  . docusate sodium  100 mg Oral BID  . donepezil  10 mg Oral QHS  . folic acid  1 mg Oral Daily  . insulin aspart  0-5 Units Subcutaneous QHS  . insulin aspart  0-9 Units Subcutaneous TID WC  . insulin aspart  3 Units Subcutaneous TID WC  . insulin glargine  14 Units Subcutaneous Daily  . multivitamin with minerals  1 tablet Oral Daily  . polyethylene glycol  17 g Oral Daily  . pyridOXINE  50 mg Oral Daily  . tamsulosin  0.4 mg Oral QPC supper  . traZODone  50 mg Oral QHS   Continuous Infusions: . meropenem (MERREM) IV       LOS: 6 days    Time spent: 34 minutes    Sharen Hones, MD [facility]   To contact the attending provider between 7A-7P or the covering provider during after hours 7P-7A, please log into the web site [email or web address] and access using universal [place] password for that web site. If you do not have the password, please call the hospital operator.  03/26/2020, 12:18 PM

## 2020-03-26 NOTE — Progress Notes (Signed)
ANTICOAGULATION CONSULT NOTE - Follow Up Consult  Pharmacy Consult for Apixaban  Indication: DVT  No Known Allergies  Patient Measurements: Height: 5\' 4"  (162.6 cm) Weight: 72.6 kg (160 lb) IBW/kg (Calculated) : 54.7  Vital Signs: Temp: 98.7 F (37.1 C) (10/06 0726) Temp Source: Oral (10/06 0726) BP: 132/38 (10/06 0726) Pulse Rate: 67 (10/06 0726)  Labs: Recent Labs    03/24/20 0257 03/24/20 0257 03/25/20 0256 03/26/20 0252  HGB 9.5*   < > 10.6* 10.3*  HCT 30.6*  --  32.1* 32.9*  PLT 284  --  381 348  CREATININE 0.67  --  0.72 0.80   < > = values in this interval not displayed.    Estimated Creatinine Clearance: 53 mL/min (by C-G formula based on SCr of 0.8 mg/dL).   Assessment: 84 yo female admitted with Acute metabolic encephalopathy secondary to urinary tract infection. Patient also found to have swelling of right lower extremity. [place]  POSITIVE for extensive bilateral calf and femoropopliteal occlusive DVT.  Plan:  Will continue apixaban 10mg  BID x 7 days followed by apixaban 5mg  BID daily. This pharmacist attempted to call patient to counsel on Eliquis, but was unable to reach the patient. An Eliquis handout was provided to the nurse to give to [name] on 10/4. I attempted to call Marylene Buerger, but was unable to reach her. If the family has any questions about anticoagulation, the pharmacy would be happy to answer any questions. Pharmacy will sign-off at this time.   Pharmacy will continue to monitor the CBC at least every 3 days while inpatient per [facility] protocol.   Rowland Lathe, PharmD Clinical Pharmacist 03/26/2020 7:46 AM

## 2020-03-26 NOTE — TOC Progression Note (Signed)
Transition of Care [facility]) - Progression Note    Patient Details  Name: TIFFANNIE SLOSS MRN: [id number] Date of Birth: 1936-05-18  [facility] CM/SW [place], RN Phone Number: 03/26/2020, 2:57 PM  Clinical Narrative:   RNCM left message for return call from patient's caregiver to discuss SNF bed options.     Expected Discharge Plan: [place] Barriers to Discharge: No Barriers Identified  Expected Discharge Plan and Services Expected Discharge Plan: [place] Choice: [place] arrangements for the past 2 months: Assisted Living Facility                                       Social Determinants of Health (SDOH) Interventions    Readmission Risk Interventions No flowsheet data found.

## 2020-03-26 NOTE — Progress Notes (Signed)
PT Cancellation Note  Patient Details Name: Carol Curtis MRN: [id number] DOB: 08-Dec-1935   Cancelled Treatment:    Reason Eval/Treat Not Completed: Other (comment) Patient is sleeping and unable to arouse with verbal stimuli. Will hold at this time and re-attempt next date/time as available for PT tx session.   Noemi Chapel, SPT Bernita Raisin 03/26/2020, 11:39 AM

## 2020-03-27 DIAGNOSIS — R338 Other retention of urine: Secondary | ICD-10-CM | POA: Diagnosis not present

## 2020-03-27 DIAGNOSIS — G309 Alzheimer's disease, unspecified: Secondary | ICD-10-CM | POA: Diagnosis not present

## 2020-03-27 DIAGNOSIS — N390 Urinary tract infection, site not specified: Secondary | ICD-10-CM | POA: Diagnosis not present

## 2020-03-27 DIAGNOSIS — G9341 Metabolic encephalopathy: Secondary | ICD-10-CM | POA: Diagnosis not present

## 2020-03-27 LAB — GLUCOSE, CAPILLARY
Glucose-Capillary: 150 mg/dL — ABNORMAL HIGH (ref 70–99)
Glucose-Capillary: 178 mg/dL — ABNORMAL HIGH (ref 70–99)
Glucose-Capillary: 243 mg/dL — ABNORMAL HIGH (ref 70–99)
Glucose-Capillary: 81 mg/dL (ref 70–99)
Glucose-Capillary: 151 mg/dL — ABNORMAL HIGH (ref 70–99)

## 2020-03-27 MED ORDER — FOSFOMYCIN TROMETHAMINE 3 G PO PACK
3.0000 g | PACK | Freq: Once | ORAL | Status: AC
  Administered 2020-03-27: 3 g via ORAL
  Filled 2020-03-27: qty 3

## 2020-03-27 NOTE — Progress Notes (Signed)
Physical Therapy Treatment Patient Details Name: Carol Curtis MRN: [id number] DOB: 11/24/1935 Today's Date: 03/27/2020    History of Present Illness Carol Curtis is a 84 y.o. female with medical history significant for dementia, diabetes mellitus, history of left breast cancer who was brought to the emergency room via EMS after an unwitnessed fall.  Pt diagnosed with acute metabolic encephalopathy secondary to urinary tract infection. All imaging was negative for any acute abnormalities.    PT Comments    Patient presents in bed on arrival to room leaning towards her R side. Pt is awake with verbal greeting, however closes eyes and unable to open them for extended period of times. Patient was grasping her L arm tightly with R hand. When we removed her hand, her L forearm was red and wrinkly. She was given a towel to squeeze instead of her arm. Pt performs supine exercises with AAROM, however quickly turns into PROM after one or two reps. Patient is repositioned towards the middle of the bed and left with all needs with nursing in room. Patient is not progressing towards goals, so will attempt one more treatment session. Pt will benefit from skilled PT services to address deficits in strength, balance, and decrease risk for future falls.   Follow Up Recommendations  SNF     Equipment Recommendations  None recommended by PT    Recommendations for Other Services       Precautions / Restrictions Precautions Precautions: Fall Restrictions Weight Bearing Restrictions: No    Mobility  Bed Mobility Overal bed mobility: Needs Assistance             General bed mobility comments: Did not perform due to patient's lethargic state  Transfers                 General transfer comment: Did not attempt this session  Ambulation/Gait             General Gait Details: Did not attempt this session   Stairs             Wheelchair Mobility    Modified Rankin  (Stroke Patients Only)       Balance       Sitting balance - Comments: Did not assess this session       Standing balance comment: Did not assess this session                            Cognition Arousal/Alertness: Lethargic Behavior During Therapy: Flat affect Overall Cognitive Status: History of cognitive impairments - at baseline                                 General Comments: When asked what her name is, patient did not say anything      Exercises General Exercises - Lower Extremity Ankle Circles/Pumps: AROM;Both;10 reps;Supine (Patient toes moving) Heel Slides: AAROM;Both;5 reps;Supine (AAROM turned into PROM) Straight Leg Raises: AAROM;Both;5 reps;Supine (AAROM turned into PROM) Other Exercises Other Exercises: Pt performed 2 sets RUE of grip strength    General Comments General comments (skin integrity, edema, etc.): Patient was grasping her L arm tightly with R hand. When we removed her hand, her L forearm was red and wrinkly.      Pertinent Vitals/Pain Pain Assessment: No/denies pain    Home Living  Prior Function            PT Goals (current goals can now be found in the care plan section) Acute Rehab PT Goals Patient Stated Goal: "To go home" PT Goal Formulation: Patient unable to participate in goal setting Progress towards PT goals: Not progressing toward goals - comment (Patient not progressing due to cognition/lethargic state)    Frequency    Min 2X/week      PT Plan Current plan remains appropriate    Co-evaluation              AM-PAC PT "6 Clicks" Mobility   Outcome Measure  Help needed turning from your back to your side while in a flat bed without using bedrails?: Total Help needed moving from lying on your back to sitting on the side of a flat bed without using bedrails?: Total Help needed moving to and from a bed to a chair (including a wheelchair)?: Total Help  needed standing up from a chair using your arms (e.g., wheelchair or bedside chair)?: Total Help needed to walk in hospital room?: Total Help needed climbing 3-5 steps with a railing? : Total 6 Click Score: 6    End of Session   Activity Tolerance: Patient limited by lethargy Patient left: in bed;with call bell/phone within reach;with bed alarm set;with nursing/sitter in room Nurse Communication: Mobility status PT Visit Diagnosis: Muscle weakness (generalized) (M62.81);Difficulty in walking, not elsewhere classified (R26.2)     Time: 1314-3888 PT Time Calculation (min) (ACUTE ONLY): 15 min  Charges:                         Noemi Chapel, SPT Bernita Raisin 03/27/2020, 9:37 AM

## 2020-03-27 NOTE — Progress Notes (Signed)
Inpatient Diabetes Program Recommendations  AACE/ADA: New Consensus Statement on Inpatient Glycemic Control   Target Ranges:  Prepandial:   less than 140 mg/dL      Peak postprandial:   less than 180 mg/dL (1-2 hours)      Critically ill patients:  140 - 180 mg/dL   Results for SHAKENA, CALLARI (MRN [id number]) as of 03/27/2020 12:58  Ref. Range 03/26/2020 07:21 03/26/2020 11:30 03/26/2020 16:56 03/26/2020 21:09 03/26/2020 22:48 03/27/2020 07:44 03/27/2020 11:49  Glucose-Capillary Latest Ref Range: 70 - 99 mg/dL 192 (H) 237 (H) 150 (H) 261 (H) 243 (H) 178 (H) 243 (H)   Review of Glycemic Control  Diabetes history: DM2 Outpatient Diabetes medications: 75/25 10-15 units BID, Metformin 1000 mg BID Current orders for Inpatient glycemic control: Lantus 14 units daily, Novolog 0-9 units TID with meals, Novolog 0-5 units QHS, Novolog 3 units TID with meals for meal coverage  Inpatient Diabetes Program Recommendations:    Insulin: Please consider increasing meal coverage to Novolog 5 units TID with meals if patient eats at least 50% of meals.  Thanks, Barnie Alderman, RN, MSN, CDE Diabetes Coordinator Inpatient Diabetes Program [phone] (Team Pager from 8am to 5pm)

## 2020-03-27 NOTE — Progress Notes (Signed)
Spoke with H bet LPN, and was made aware that the patient does not need the iv access at this moment. The patient does not have any iv medications ordered and has pulled out her iv. The nurse was made aware to replace the Iv team consult if the patient ends up needing a piv.

## 2020-03-27 NOTE — Progress Notes (Signed)
PROGRESS NOTE    Carol Curtis  POE:[id number] DOB: 1935-10-30 DOA: 03/20/2020 PCP: Orvis Brill, [facility] Making   Complaint.  Fall. Brief Narrative:  Patient is 84 year old female with history of dementia, type 2 diabetes, history of left breast cancer who was brought to the emergency room after an unwitnessed fall.  She is placed on meropenem for urinary tract infection with E. coli ESBL.  10/7.  Patient completed about 6 days of meropenem.  She continued to have urinary retention, Foley catheter was anchored.  Will give fosfomycin 1 dose.   Assessment & Plan:   Principal Problem:   Acute metabolic encephalopathy Active Problems:   Dementia ([place])   UTI due to extended-spectrum beta lactamase (ESBL) producing Escherichia coli   Fall   Weakness   Type 2 diabetes mellitus with hyperlipidemia (HCC)   Right leg swelling   Acute deep vein thrombosis (DVT) of popliteal vein of both lower extremities (HCC)   Acute urinary retention   Constipation  #1. acute metabolic encephalopathy secondary to UTI.   Mental status has improved to baseline.  Still was confused without agitation.  2.  E. coli ESBL urinary tract infection. Finished 6 days of meropenem.  Patient condition complicated by urinary retention, she will be given another dose of fosfomycin.  3.  Urinary retention. Foley catheter was anchored.  Start Flomax, patient will need follow-up with urology as outpatient.  4.  Extensive bilateral lower extremity DVT. Continue Eliquis.  5.  Dementia with behavior disturbance. Continue Aricept.  6.  Type 2 diabetes uncontrolled with hyperglycemia. Continue current regimen.  7.  Urinary retention. Continue Foley catheter which is anchored last night.  8.  Constipation.  Continue stool softener as needed.  Last bowel movement recorded 2 days ago.      DVT prophylaxis: Eliquis Code Status: Full Family Communication: None  .   Status is: Inpatient  Remains inpatient  appropriate because:Unsafe d/c plan   Dispo: The patient is from: Home              Anticipated d/c is to: SNF              Anticipated d/c date is: 1 day              Patient currently is medically stable to d/c.        I/O last 3 completed shifts: In: 223.5 [I.V.:23.5; IV Piggyback:200] Out: 825 [Urine:825] No intake/output data recorded.     Consultants:   None  Procedures: None  Antimicrobials: Fosfomycin  Subjective: Patient has some confusion, otherwise she does not have any complaints. No fever or chills. No abdominal pain nausea vomiting.   Objective: Vitals:   03/26/20 0726 03/26/20 1602 03/26/20 2343 03/27/20 0742  BP: (!) 132/38 124/63 132/64 106/68  Pulse: 67 78 79 97  Resp: 18 20 15 18   Temp: 98.7 F (37.1 C) 98.4 F (36.9 C) 97.9 F (36.6 C) 98.6 F (37 C)  TempSrc: Oral Oral Oral   SpO2: 98% 100% 100% 97%  Weight:      Height:        Intake/Output Summary (Last 24 hours) at 03/27/2020 1430 Last data filed at 03/27/2020 0510 Gross per 24 hour  Intake 223.48 ml  Output 825 ml  Net -601.52 ml   Filed Weights   03/20/20 0605  Weight: 72.6 kg    Examination:  General exam: Appears calm and comfortable  Respiratory system: Clear to auscultation. Respiratory effort normal. Cardiovascular system: S1 &  S2 heard, RRR. No JVD, murmurs, rubs, gallops or clicks. 1+ pedal edema. Gastrointestinal system: Abdomen is nondistended, soft and nontender. No organomegaly or masses felt. Normal bowel sounds heard. Central nervous system: Alert and oriented x2. No focal neurological deficits. Extremities: Symmetric  Skin: No rashes, lesions or ulcers Psychiatry:  Mood & affect appropriate.     Data Reviewed: I have personally reviewed following labs and imaging studies  CBC: Recent Labs  Lab 03/21/20 0527 03/23/20 0416 03/24/20 0257 03/25/20 0256 03/26/20 0252  WBC 9.7 8.3 7.8 9.6 10.5  HGB 10.5* 9.8* 9.5* 10.6* 10.3*  HCT 32.1* 30.2*  30.6* 32.1* 32.9*  MCV 78.3* 77.2* 80.1 77.5* 79.5*  PLT 220 263 284 381 621   Basic Metabolic Panel: Recent Labs  Lab 03/21/20 0527 03/24/20 0257 03/25/20 0256 03/26/20 0252  NA 136 141 138 136  K 3.9 4.0 4.5 4.1  CL 102 104 103 102  CO2 25 29 26 25   GLUCOSE 141* 183* 188* 217*  BUN 10 10 7* 12  CREATININE 0.75 0.67 0.72 0.80  CALCIUM 8.8* 8.6* 8.7* 8.9   GFR: Estimated Creatinine Clearance: 53 mL/min (by C-G formula based on SCr of 0.8 mg/dL). Liver Function Tests: Recent Labs  Lab 03/25/20 0256  AST 23  ALT 9  ALKPHOS 84  BILITOT 0.7  PROT 7.0  ALBUMIN 2.5*   No results for input(s): LIPASE, AMYLASE in the last 168 hours. No results for input(s): AMMONIA in the last 168 hours. Coagulation Profile: No results for input(s): INR, PROTIME in the last 168 hours. Cardiac Enzymes: No results for input(s): CKTOTAL, CKMB, CKMBINDEX, TROPONINI in the last 168 hours. BNP (last 3 results) No results for input(s): PROBNP in the last 8760 hours. HbA1C: No results for input(s): HGBA1C in the last 72 hours. CBG: Recent Labs  Lab 03/26/20 1656 03/26/20 2109 03/26/20 2248 03/27/20 0744 03/27/20 1149  GLUCAP 150* 261* 243* 178* 243*   Lipid Profile: No results for input(s): CHOL, HDL, LDLCALC, TRIG, CHOLHDL, LDLDIRECT in the last 72 hours. Thyroid Function Tests: No results for input(s): TSH, T4TOTAL, FREET4, T3FREE, THYROIDAB in the last 72 hours. Anemia Panel: No results for input(s): VITAMINB12, FOLATE, FERRITIN, TIBC, IRON, RETICCTPCT in the last 72 hours. Sepsis Labs: No results for input(s): PROCALCITON, LATICACIDVEN in the last 168 hours.  Recent Results (from the past 240 hour(s))  Urine Culture     Status: Abnormal   Collection Time: 03/20/20  8:09 AM   Specimen: Urine, Clean Catch  Result Value Ref Range Status   Specimen Description   Final    URINE, CLEAN CATCH Performed at [facility], [address] [place], [place] [address]    Special  Requests   Final    NONE Performed at [facility], [address] [place], [place] [address]    Culture (A)  Final    >=100,000 COLONIES/mL ESCHERICHIA COLI Confirmed Extended Spectrum Beta-Lactamase Producer (ESBL).  In bloodstream infections from ESBL organisms, carbapenems are preferred over piperacillin/tazobactam. They are shown to have a lower risk of mortality.    Report Status 03/23/2020 FINAL  Final   Organism ID, Bacteria ESCHERICHIA COLI (A)  Final      Susceptibility   Escherichia coli - MIC*    AMPICILLIN >=32 RESISTANT Resistant     CEFAZOLIN >=64 RESISTANT Resistant     CEFTRIAXONE >=64 RESISTANT Resistant     CIPROFLOXACIN >=4 RESISTANT Resistant     GENTAMICIN <=1 SENSITIVE Sensitive     IMIPENEM <=  0.25 SENSITIVE Sensitive     NITROFURANTOIN <=16 SENSITIVE Sensitive     TRIMETH/SULFA >=320 RESISTANT Resistant     AMPICILLIN/SULBACTAM >=32 RESISTANT Resistant     PIP/TAZO 64 INTERMEDIATE Intermediate     * >=100,000 COLONIES/mL ESCHERICHIA COLI  Respiratory Panel by RT PCR (Flu A&B, Covid) - Nasopharyngeal Swab     Status: None   Collection Time: 03/20/20 10:21 AM   Specimen: Nasopharyngeal Swab  Result Value Ref Range Status   SARS Coronavirus 2 by RT PCR NEGATIVE NEGATIVE Final    Comment: (NOTE) SARS-CoV-2 target nucleic acids are NOT DETECTED.  The SARS-CoV-2 RNA is generally detectable in upper respiratoy specimens during the acute phase of infection. The lowest concentration of SARS-CoV-2 viral copies this assay can detect is 131 copies/mL. A negative result does not preclude SARS-Cov-2 infection and should not be used as the sole basis for treatment or other patient management decisions. A negative result may occur with  improper specimen collection/handling, submission of specimen other than nasopharyngeal swab, presence of viral mutation(s) within the areas targeted by this assay, and inadequate number of viral copies (<131 copies/mL). A  negative result must be combined with clinical observations, patient history, and epidemiological information. The expected result is Negative.  Fact Sheet for Patients:  PinkCheek.be  Fact Sheet for Healthcare Providers:  GravelBags.it  This test is no t yet approved or cleared by the [place] FDA and  has been authorized for detection and/or diagnosis of SARS-CoV-2 by FDA under an Emergency Use Authorization (EUA). This EUA will remain  in effect (meaning this test can be used) for the duration of the COVID-19 declaration under Section 564(b)(1) of the Act, 21 U.S.C. section 360bbb-3(b)(1), unless the authorization is terminated or revoked sooner.     Influenza A by PCR NEGATIVE NEGATIVE Final   Influenza B by PCR NEGATIVE NEGATIVE Final    Comment: (NOTE) The Xpert Xpress SARS-CoV-2/FLU/RSV assay is intended as an aid in  the diagnosis of influenza from Nasopharyngeal swab specimens and  should not be used as a sole basis for treatment. Nasal washings and  aspirates are unacceptable for Xpert Xpress SARS-CoV-2/FLU/RSV  testing.  Fact Sheet for Patients: PinkCheek.be  Fact Sheet for Healthcare Providers: GravelBags.it  This test is not yet approved or cleared by the [place] FDA and  has been authorized for detection and/or diagnosis of SARS-CoV-2 by  FDA under an Emergency Use Authorization (EUA). This EUA will remain  in effect (meaning this test can be used) for the duration of the  Covid-19 declaration under Section 564(b)(1) of the Act, 21  U.S.C. section 360bbb-3(b)(1), unless the authorization is  terminated or revoked. Performed at [facility], [address] [place], [place] [address]          Radiology Studies: No results found.      Scheduled Meds: . apixaban  10 mg Oral BID   Followed by  . [START ON  03/29/2020] apixaban  5 mg Oral BID  . Chlorhexidine Gluconate Cloth  6 each Topical Daily  . docusate sodium  100 mg Oral BID  . donepezil  10 mg Oral QHS  . folic acid  1 mg Oral Daily  . fosfomycin  3 g Oral Once  . insulin aspart  0-5 Units Subcutaneous QHS  . insulin aspart  0-9 Units Subcutaneous TID WC  . insulin aspart  3 Units Subcutaneous TID WC  . insulin glargine  14 Units Subcutaneous Daily  . multivitamin with  minerals  1 tablet Oral Daily  . polyethylene glycol  17 g Oral Daily  . pyridOXINE  50 mg Oral Daily  . tamsulosin  0.4 mg Oral QPC supper  . traZODone  50 mg Oral QHS   Continuous Infusions: . sodium chloride 10 mL/hr at 03/27/20 0631     LOS: 7 days    Time spent: 27 minutes    Sharen Hones, MD [facility]   To contact the attending provider between 7A-7P or the covering provider during after hours 7P-7A, please log into the web site [email or web address] and access using universal [place] password for that web site. If you do not have the password, please call the hospital operator.  03/27/2020, 2:30 PM

## 2020-03-27 NOTE — TOC Progression Note (Signed)
Transition of Care [facility]) - Progression Note    Patient Details  Name: Carol Curtis MRN: [id number] Date of Birth: 08-Dec-1935  [facility] CM/SW Contact  Shelbie Ammons, RN Phone Number: 03/27/2020, 9:41 AM  Clinical Narrative:   RNCM received return call from patient's caregiver Carol Curtis. Beds were presented and [name] reported she would look into them and call back. Shortly after she returned call to say they would accept bed at [facility].  RNCM notified [facility] as well as accepting bed through the hub, RNCM started insurance auth through the [employer] Portal.     Expected Discharge Plan: [place] Barriers to Discharge: No Barriers Identified  Expected Discharge Plan and Services Expected Discharge Plan: [place] Choice: [place] arrangements for the past 2 months: Assisted Living Facility                                       Social Determinants of Health (SDOH) Interventions    Readmission Risk Interventions No flowsheet data found.

## 2020-03-28 DIAGNOSIS — R4182 Altered mental status, unspecified: Secondary | ICD-10-CM | POA: Diagnosis not present

## 2020-03-28 DIAGNOSIS — N3000 Acute cystitis without hematuria: Secondary | ICD-10-CM | POA: Diagnosis not present

## 2020-03-28 DIAGNOSIS — R338 Other retention of urine: Secondary | ICD-10-CM | POA: Diagnosis not present

## 2020-03-28 LAB — GLUCOSE, CAPILLARY
Glucose-Capillary: 103 mg/dL — ABNORMAL HIGH (ref 70–99)
Glucose-Capillary: 175 mg/dL — ABNORMAL HIGH (ref 70–99)
Glucose-Capillary: 135 mg/dL — ABNORMAL HIGH (ref 70–99)
Glucose-Capillary: 127 mg/dL — ABNORMAL HIGH (ref 70–99)

## 2020-03-28 MED ORDER — APIXABAN 5 MG PO TABS
10.0000 mg | ORAL_TABLET | Freq: Two times a day (BID) | ORAL | 0 refills | Status: DC
Start: 2020-03-28 — End: 2020-03-31

## 2020-03-28 MED ORDER — APIXABAN 5 MG PO TABS
5.0000 mg | ORAL_TABLET | Freq: Two times a day (BID) | ORAL | 0 refills | Status: DC
Start: 2020-03-29 — End: 2020-03-31

## 2020-03-28 MED ORDER — TAMSULOSIN HCL 0.4 MG PO CAPS: 0.4000 mg | ORAL_CAPSULE | Freq: Every day | ORAL | 0 refills | Status: AC

## 2020-03-28 MED ORDER — FOSFOMYCIN TROMETHAMINE 3 G PO PACK: 3.0000 g | PACK | Freq: Once | ORAL | 0 refills | Status: DC

## 2020-03-28 NOTE — Progress Notes (Signed)
Physical Therapy Treatment Patient Details Name: Carol Curtis MRN: [id number] DOB: December 06, 1935 Today's Date: 03/28/2020    History of Present Illness Carol Curtis is a 84 y.o. female with medical history significant for dementia, diabetes mellitus, history of left breast cancer who was brought to the emergency room via EMS after an unwitnessed fall.  Pt diagnosed with acute metabolic encephalopathy secondary to urinary tract infection. All imaging was negative for any acute abnormalities.    PT Comments    Pt was long sitting in bed awake upon arriving. She agrees Regulatory affairs officer but is disoriented with baseline dementia. Per discussion with POA, pt is ambulatory at baseline. She has difficulty following commands but with increased time and tactile cues, is able to partipate. Max assist to exit bed and return to supine after standing 3 x EOB. Max assist for safety with mobility and transfers. Repositioned pt in bed post session with call bell in reach, and bed alarm in place. She will benefit form SNF at DC to address deficits and return to PLOF.       Follow Up Recommendations  SNF     Equipment Recommendations  None recommended by PT    Recommendations for Other Services       Precautions / Restrictions Precautions Precautions: Fall Restrictions Weight Bearing Restrictions: No    Mobility  Bed Mobility Overal bed mobility: Needs Assistance Bed Mobility: Supine to Sit;Sit to Supine     Supine to sit: Max assist;HOB elevated Sit to supine: Max assist   General bed mobility comments: Pt was able to progress from long sitting to EOB short sit with increased time and max assist. requires constant tacle cues for LE and upper body progression  Transfers Overall transfer level: Needs assistance Equipment used: Rolling walker (2 wheeled) Transfers: Sit to/from Stand Sit to Stand: Max assist;From elevated surface         General transfer comment: Max assist to stand 3  x EOB to RW. max vcs and assist to initiate movements. once pt initiates movements does participate in desired task.   Ambulation/Gait             General Gait Details: unsafe to trial at this time but per POA was ambulatory prior to recent fall       Balance Overall balance assessment: Needs assistance Sitting-balance support: Bilateral upper extremity supported;Feet supported Sitting balance-Leahy Scale: Fair     Standing balance support: Bilateral upper extremity supported;During functional activity Standing balance-Leahy Scale: Poor         Cognition Arousal/Alertness: Awake/alert Behavior During Therapy: Flat affect Overall Cognitive Status: History of cognitive impairments - at baseline      General Comments: Pt is able to talk and inconsistently able to follow commands. per discussion with pt's POA she was ambulatory prior to recent fall             Pertinent Vitals/Pain Pain Assessment:  (unable to rate 2/2 to cognition)           PT Goals (current goals can now be found in the care plan section) Acute Rehab PT Goals Patient Stated Goal: none stated. Limited verbal exchange today Progress towards PT goals: Progressing toward goals    Frequency    Min 2X/week      PT Plan Current plan remains appropriate       AM-PAC PT "6 Clicks" Mobility   Outcome Measure  Help needed turning from your back to your side while in a flat  bed without using bedrails?: A Lot Help needed moving from lying on your back to sitting on the side of a flat bed without using bedrails?: A Lot Help needed moving to and from a bed to a chair (including a wheelchair)?: A Lot Help needed standing up from a chair using your arms (e.g., wheelchair or bedside chair)?: A Lot Help needed to walk in hospital room?: A Lot Help needed climbing 3-5 steps with a railing? : A Lot 6 Click Score: 12    End of Session Equipment Utilized During Treatment: Gait belt Activity Tolerance:  Patient tolerated treatment well;Other (comment) (limited by cognition deficits) Patient left: in bed;with call bell/phone within reach;with bed alarm set;with nursing/sitter in room Nurse Communication: Mobility status PT Visit Diagnosis: Muscle weakness (generalized) (M62.81);Difficulty in walking, not elsewhere classified (R26.2)     Time: 5790-3833 PT Time Calculation (min) (ACUTE ONLY): 10 min  Charges:  $Therapeutic Activity: 8-22 mins                     Julaine Fusi PTA 03/28/20, 2:34 PM

## 2020-03-28 NOTE — Discharge Summary (Signed)
Physician Discharge Summary  Patient ID: Carol Curtis MRN: [id number] DOB/AGE: Nov 11, 1935 84 y.o.  Admit date: 03/20/2020 Discharge date: 03/28/2020  Admission Diagnoses:  Discharge Diagnoses:  Principal Problem:   Acute metabolic encephalopathy Active Problems:   Dementia ([place])   UTI due to extended-spectrum beta lactamase (ESBL) producing Escherichia coli   Fall   Weakness   Type 2 diabetes mellitus with hyperlipidemia (HCC)   Right leg swelling   Acute deep vein thrombosis (DVT) of popliteal vein of both lower extremities (HCC)   Acute urinary retention   Constipation   Discharged Condition: good  Hospital Course:  Patient is 84 year old female with history of dementia, type 2 diabetes, history of left breast cancer who was brought to the emergency room afteran unwitnessed fall. She is placed on meropenem for urinary tract infection with E. coli ESBL.  10/7.  Patient completed about 6 days of meropenem.  She continued to have urinary retention, Foley catheter was anchored.  Will give fosfomycin 1 dose.  10/8.  Patient condition has improved.  Bed available in the nursing home, she will be transferred today.  Patient was treated for the following conditions:  #1. acute metabolic encephalopathy secondary to UTI.   Mental status has improved to baseline.  Still confused without agitation.  2.  E. coli ESBL urinary tract infection. Finished 6 days of meropenem.  Patient condition complicated by urinary retention, she was given another dose of fosfomycin.  She will be given another dose in the nursing home.  3.  Urinary retention. Foley catheter was anchored.  Started Flomax, patient will need follow-up with urology as outpatient.  4.  Extensive bilateral lower extremity DVT. Continue Eliquis.  5.  Dementia with behavior disturbance. Continue Aricept.  6.  Type 2 diabetes uncontrolled with hyperglycemia. Continue current regimen.  7.  Urinary  retention. Continue Foley catheter which is anchored last night.  8.  Constipation.   Improved.   Consults: None  Significant Diagnostic Studies: CT ABDOMEN AND PELVIS WITHOUT CONTRAST  TECHNIQUE: Multidetector CT imaging of the abdomen and pelvis was performed following the standard protocol without IV contrast.  COMPARISON:  None.  FINDINGS: Lower chest: Minimal dependent atelectasis. Aortic atherosclerotic calcification. Coronary artery calcification.  Hepatobiliary: Liver appears normal without contrast. Small calcified stones in the gallbladder neck without CT evidence cholecystitis or obstruction.  Pancreas: Normal  Spleen: Normal  Adrenals/Urinary Tract: Adrenal glands are normal. Left kidney is normal. Right kidney shows a few small parenchymal calcifications at the lower pole adjacent to a small focus of atrophy. No evidence stone in the collecting system. No hydroureteronephrosis on either side. No gross bladder abnormality visible. Some air in the bladder presumed secondary to catheterization. If not, this could be secondary to infectious cystitis.  Stomach/Bowel: Stomach and small intestine are unremarkable. Moderate amount of fecal matter without evidence of primary colon pathology.  Vascular/Lymphatic: Aortic atherosclerosis without aneurysm. IVC filter in place. Thrombus present within the distal IVC and iliac veins.  Reproductive: Previous hysterectomy.  No pelvic mass.  Other: No free fluid or air.  Musculoskeletal: Chronic spinal degenerative changes. No acute bone finding.  IMPRESSION: 1. Some air in the bladder presumed secondary to catheterization. If not, this could be secondary to infectious cystitis. 2. No evidence of urinary tract stone disease or other acute urinary tract pathology. Small parenchymal calcifications in the lower pole the right kidney adjacent to a region of atrophy. 3. IVC filter in place. Thrombus  present within the distal IVC and iliac  veins. 4. Aortic atherosclerosis. Coronary artery calcification. 5. Small calcified stones in the gallbladder neck without CT evidence of cholecystitis or obstruction.  Aortic Atherosclerosis (ICD10-I70.0).   Electronically Signed   By: Nelson Chimes M.D.   On: 03/25/2020 08:54  BILATERAL LOWER EXTREMITY VENOUS DOPPLER ULTRASOUND  TECHNIQUE: Gray-scale sonography with compression, as well as color and duplex ultrasound, were performed to evaluate the deep venous system(s) from the level of the common femoral vein through the popliteal and proximal calf veins.  COMPARISON:  None.  FINDINGS: VENOUS  On the left, extensive occlusive thrombus in the common femoral vein, visualized deep femoral vein, proximal and mid superficial femoral vein. Flow signal is identified in the distal superficial femoral vein. There is occlusive thrombus through the visualized popliteal vein. Noncompressible thrombosed posterior tibial vein. Visualized peroneal veins patent.  On the right, occlusive thrombus through common femoral, femoral, and popliteal veins with minimal flow signal on color Doppler. Occlusive posterior tibial and peroneal vein DVT.  OTHER  None.  Limitations: none  IMPRESSION: 1. POSITIVE for extensive bilateral calf and femoropopliteal occlusive DVT.   Electronically Signed   By: Lucrezia Europe M.D.   On: 03/22/2020 13:46    Treatments: Meropenem, eliquis  Discharge Exam: Blood pressure (!) 135/53, pulse 64, temperature 98.2 F (36.8 C), resp. rate 16, height 5\' 4"  (1.626 m), weight 72.6 kg, SpO2 100 %. General appearance: alert, cooperative and Oriented to person and place. Resp: clear to auscultation bilaterally Cardio: regular rate and rhythm, S1, S2 normal, no murmur, click, rub or gallop GI: soft, non-tender; bowel sounds normal; no masses,  no organomegaly Extremities: 1+ edema  Disposition:  Discharge disposition: 03-Skilled Nursing Facility       Discharge Instructions    Diet - low sodium heart healthy   Complete by: As directed    Increase activity slowly   Complete by: As directed      Allergies as of 03/28/2020   No Known Allergies     Medication List    TAKE these medications   amLODipine 5 MG tablet Commonly known as: NORVASC Take 5 mg by mouth daily.   apixaban 5 MG Tabs tablet Commonly known as: ELIQUIS Take 2 tablets (10 mg total) by mouth 2 (two) times daily for 2 doses.   apixaban 5 MG Tabs tablet Commonly known as: ELIQUIS Take 1 tablet (5 mg total) by mouth 2 (two) times daily. Start taking on: March 29, 2020   aspirin 81 MG chewable tablet Chew 81 mg by mouth daily.   atorvastatin 20 MG tablet Commonly known as: LIPITOR Take 20 mg by mouth daily.   B-complex with vitamin C tablet Take 1 tablet by mouth daily.   Calcium-Magnesium-Zinc 334-134-5 MG Tabs Take 1 tablet by mouth every evening.   Centrum Silver 50+Women Tabs Take 1 tablet by mouth daily. What changed: Another medication with the same name was removed. Continue taking this medication, and follow the directions you see here.   donepezil 10 MG tablet Commonly known as: ARICEPT Take 10 mg by mouth at bedtime.   folic acid 1 MG tablet Commonly known as: FOLVITE Take 1 mg by mouth daily.   fosfomycin 3 g Pack Commonly known as: MONUROL Take 3 g by mouth once for 1 dose. Start taking on: March 30, 2020   furosemide 20 MG tablet Commonly known as: LASIX Take 20 mg by mouth. What changed: Another medication with the same name was removed. Continue taking this medication, and follow the  directions you see here.   insulin lispro protamine-lispro (75-25) 100 UNIT/ML Susp injection Commonly known as: HUMALOG 75/25 MIX Inject 10-15 Units into the skin 2 (two) times daily with a meal.   metFORMIN 1000 MG tablet Commonly known as: GLUCOPHAGE Take 1,000 mg by mouth 2  (two) times daily with a meal.   pyridOXINE 50 MG tablet Commonly known as: VITAMIN B-6 Take 50 mg by mouth daily.   tamsulosin 0.4 MG Caps capsule Commonly known as: FLOMAX Take 1 capsule (0.4 mg total) by mouth daily after supper.   traZODone 50 MG tablet Commonly known as: DESYREL Take 50 mg by mouth at bedtime.       Contact information for follow-up providers    Stoioff, Ronda Fairly, MD Follow up in 1 week(s).   Specialty: Urology Contact information: [address] [place] [address] [phone]            Contact information for after-discharge care    Destination    [facility] Preferred [facility]F .   Service: Skilled Nursing Contact information: [address] [place] [phone]                 35 minutes Signed: Sharen Hones 03/28/2020, 9:00 AM

## 2020-03-28 NOTE — Care Management Important Message (Signed)
Important Message  Patient Details  Name: SAMIRAH SCARPATI MRN: [id number] Date of Birth: 1935-07-20   Medicare Important Message Given:  Yes  Reviewed with Marylene Buerger via phone.  Declines copy of Medicare IM at this time.    Dannette Barbara 03/28/2020, 1:14 PM

## 2020-03-28 NOTE — TOC Progression Note (Signed)
Transition of Care [facility]) - Progression Note    Patient Details  Name: Carol Curtis MRN: [id number] Date of Birth: 18-May-1936  Transition of Care [facility]) CM/SW [place], RN Phone Number: 03/28/2020, 2:46 PM  Clinical Narrative:   RNCM sent updated clinicals through [employer] portal for review.     Expected Discharge Plan: [place] Barriers to Discharge: No Barriers Identified  Expected Discharge Plan and Services Expected Discharge Plan: [place] Choice: [place] arrangements for the past 2 months: [place] Expected Discharge Date: 03/28/20                                     Social Determinants of Health (SDOH) Interventions    Readmission Risk Interventions No flowsheet data found.

## 2020-03-29 DIAGNOSIS — G9341 Metabolic encephalopathy: Secondary | ICD-10-CM | POA: Diagnosis not present

## 2020-03-29 DIAGNOSIS — R338 Other retention of urine: Secondary | ICD-10-CM | POA: Diagnosis not present

## 2020-03-29 DIAGNOSIS — I82433 Acute embolism and thrombosis of popliteal vein, bilateral: Secondary | ICD-10-CM | POA: Diagnosis not present

## 2020-03-29 DIAGNOSIS — N390 Urinary tract infection, site not specified: Secondary | ICD-10-CM | POA: Diagnosis not present

## 2020-03-29 LAB — GLUCOSE, CAPILLARY
Glucose-Capillary: 164 mg/dL — ABNORMAL HIGH (ref 70–99)
Glucose-Capillary: 144 mg/dL — ABNORMAL HIGH (ref 70–99)
Glucose-Capillary: 164 mg/dL — ABNORMAL HIGH (ref 70–99)

## 2020-03-29 MED ORDER — LACTULOSE 10 GM/15ML PO SOLN
20.0000 g | Freq: Once | ORAL | Status: DC
  Filled 2020-03-29: qty 30

## 2020-03-29 NOTE — Progress Notes (Signed)
PROGRESS NOTE    Carol Curtis  VQQ:[id number] DOB: 10-25-1935 DOA: 03/20/2020 PCP: Orvis Brill, [facility]  Chief complaint: fall  Brief Narrative:  Patient is 84 year old female with history of dementia, type 2 diabetes, history of left breast cancer who was brought to the emergency room afteran unwitnessed fall. She is placed on meropenem for urinary tract infection with E. coli ESBL.  10/7.  Patient completed about 6 days of meropenem.  She continued to have urinary retention, Foley catheter was anchored.  Will give fosfomycin 1 dose.    Assessment & Plan:   Principal Problem:   Acute metabolic encephalopathy Active Problems:   Dementia ([place])   UTI due to extended-spectrum beta lactamase (ESBL) producing Escherichia coli   Fall   Weakness   Type 2 diabetes mellitus with hyperlipidemia (HCC)   Right leg swelling   Acute deep vein thrombosis (DVT) of popliteal vein of both lower extremities (HCC)   Acute urinary retention   Constipation  #1.  Acute metabolic encephalopathy secondary to UTI. Condition has improved, patient is at baseline.  2.  E. coli ESBL urinary tract infection. Finished antibiotics.  3.  Urinary retention. Continue Foley catheter, continue Flomax.  4 extensive bilateral lower extremity DVT. Continue Eliquis.  5.  Dementia with behavioral disturbance. Patient currently has no agitation.  Continue Aricept.  6.  Type 2 diabetes uncontrolled with hyperglycemia. Continue current regimen.  7.  Constipation. We will give a dose of lactulose.    DVT prophylaxis: Eliquis Code Status: Full Family Communication: None     Status is: Inpatient  Remains inpatient appropriate because:Unsafe d/c plan   Dispo: The patient is from: ALF              Anticipated d/c is to: SNF              Anticipated d/c date is: 2 days              Patient currently is medically stable to d/c.        I/O last 3 completed shifts: In: 480  [P.O.:480] Out: 675 [Urine:675] No intake/output data recorded.     Consultants:   None  Procedures: None  Antimicrobials: None  Subjective: Patient just woke up, confused. Does not have any short of breath or cough. No abdominal pain or nausea vomiting. No fever or chills.  Objective: Vitals:   03/28/20 0725 03/28/20 1639 03/28/20 2332 03/29/20 0742  BP: (!) 135/53 138/65 (!) 150/60 (!) 157/51  Pulse: 64 71 76 (!) 58  Resp: 16 17 18 17   Temp: 98.2 F (36.8 C) 98.9 F (37.2 C) 98.6 F (37 C)   TempSrc:  Oral    SpO2: 100% 99% 100% 100%  Weight:      Height:        Intake/Output Summary (Last 24 hours) at 03/29/2020 0822 Last data filed at 03/29/2020 0631 Gross per 24 hour  Intake 480 ml  Output 270 ml  Net 210 ml   Filed Weights   03/20/20 0605  Weight: 72.6 kg    Examination:  General exam: Appears calm and comfortable  Respiratory system: Clear to auscultation. Respiratory effort normal. Cardiovascular system: S1 & S2 heard, RRR. No JVD, murmurs, rubs, gallops or clicks. No pedal edema. Gastrointestinal system: Abdomen is nondistended, soft and nontender. No organomegaly or masses felt. Normal bowel sounds heard. Central nervous system: Alert and confused. No focal neurological deficits. Extremities: Symmetric  Skin: No rashes, lesions or ulcers  Data Reviewed: I have personally reviewed following labs and imaging studies  CBC: Recent Labs  Lab 03/23/20 0416 03/24/20 0257 03/25/20 0256 03/26/20 0252  WBC 8.3 7.8 9.6 10.5  HGB 9.8* 9.5* 10.6* 10.3*  HCT 30.2* 30.6* 32.1* 32.9*  MCV 77.2* 80.1 77.5* 79.5*  PLT 263 284 381 269   Basic Metabolic Panel: Recent Labs  Lab 03/24/20 0257 03/25/20 0256 03/26/20 0252  NA 141 138 136  K 4.0 4.5 4.1  CL 104 103 102  CO2 29 26 25   GLUCOSE 183* 188* 217*  BUN 10 7* 12  CREATININE 0.67 0.72 0.80  CALCIUM 8.6* 8.7* 8.9   GFR: Estimated Creatinine Clearance: 53 mL/min (by C-G formula based  on SCr of 0.8 mg/dL). Liver Function Tests: Recent Labs  Lab 03/25/20 0256  AST 23  ALT 9  ALKPHOS 84  BILITOT 0.7  PROT 7.0  ALBUMIN 2.5*   No results for input(s): LIPASE, AMYLASE in the last 168 hours. No results for input(s): AMMONIA in the last 168 hours. Coagulation Profile: No results for input(s): INR, PROTIME in the last 168 hours. Cardiac Enzymes: No results for input(s): CKTOTAL, CKMB, CKMBINDEX, TROPONINI in the last 168 hours. BNP (last 3 results) No results for input(s): PROBNP in the last 8760 hours. HbA1C: No results for input(s): HGBA1C in the last 72 hours. CBG: Recent Labs  Lab 03/27/20 2106 03/28/20 0726 03/28/20 1206 03/28/20 1637 03/28/20 2118  GLUCAP 151* 103* 175* 135* 127*   Lipid Profile: No results for input(s): CHOL, HDL, LDLCALC, TRIG, CHOLHDL, LDLDIRECT in the last 72 hours. Thyroid Function Tests: No results for input(s): TSH, T4TOTAL, FREET4, T3FREE, THYROIDAB in the last 72 hours. Anemia Panel: No results for input(s): VITAMINB12, FOLATE, FERRITIN, TIBC, IRON, RETICCTPCT in the last 72 hours. Sepsis Labs: No results for input(s): PROCALCITON, LATICACIDVEN in the last 168 hours.  Recent Results (from the past 240 hour(s))  Urine Culture     Status: Abnormal   Collection Time: 03/20/20  8:09 AM   Specimen: Urine, Clean Catch  Result Value Ref Range Status   Specimen Description   Final    URINE, CLEAN CATCH Performed at [facility], [address], [place], [place] [address]    Special Requests   Final    NONE Performed at [facility], [address] [place], [place] [address]    Culture (A)  Final    >=100,000 COLONIES/mL ESCHERICHIA COLI Confirmed Extended Spectrum Beta-Lactamase Producer (ESBL).  In bloodstream infections from ESBL organisms, carbapenems are preferred over piperacillin/tazobactam. They are shown to have a lower risk of mortality.    Report Status 03/23/2020 FINAL  Final   Organism ID,  Bacteria ESCHERICHIA COLI (A)  Final      Susceptibility   Escherichia coli - MIC*    AMPICILLIN >=32 RESISTANT Resistant     CEFAZOLIN >=64 RESISTANT Resistant     CEFTRIAXONE >=64 RESISTANT Resistant     CIPROFLOXACIN >=4 RESISTANT Resistant     GENTAMICIN <=1 SENSITIVE Sensitive     IMIPENEM <=0.25 SENSITIVE Sensitive     NITROFURANTOIN <=16 SENSITIVE Sensitive     TRIMETH/SULFA >=320 RESISTANT Resistant     AMPICILLIN/SULBACTAM >=32 RESISTANT Resistant     PIP/TAZO 64 INTERMEDIATE Intermediate     * >=100,000 COLONIES/mL ESCHERICHIA COLI  Respiratory Panel by RT PCR (Flu A&B, Covid) - Nasopharyngeal Swab     Status: None   Collection Time: 03/20/20 10:21 AM   Specimen: Nasopharyngeal Swab  Result Value  Ref Range Status   SARS Coronavirus 2 by RT PCR NEGATIVE NEGATIVE Final    Comment: (NOTE) SARS-CoV-2 target nucleic acids are NOT DETECTED.  The SARS-CoV-2 RNA is generally detectable in upper respiratoy specimens during the acute phase of infection. The lowest concentration of SARS-CoV-2 viral copies this assay can detect is 131 copies/mL. A negative result does not preclude SARS-Cov-2 infection and should not be used as the sole basis for treatment or other patient management decisions. A negative result may occur with  improper specimen collection/handling, submission of specimen other than nasopharyngeal swab, presence of viral mutation(s) within the areas targeted by this assay, and inadequate number of viral copies (<131 copies/mL). A negative result must be combined with clinical observations, patient history, and epidemiological information. The expected result is Negative.  Fact Sheet for Patients:  PinkCheek.be  Fact Sheet for Healthcare Providers:  GravelBags.it  This test is no t yet approved or cleared by the [place] FDA and  has been authorized for detection and/or diagnosis of SARS-CoV-2 by FDA  under an Emergency Use Authorization (EUA). This EUA will remain  in effect (meaning this test can be used) for the duration of the COVID-19 declaration under Section 564(b)(1) of the Act, 21 U.S.C. section 360bbb-3(b)(1), unless the authorization is terminated or revoked sooner.     Influenza A by PCR NEGATIVE NEGATIVE Final   Influenza B by PCR NEGATIVE NEGATIVE Final    Comment: (NOTE) The Xpert Xpress SARS-CoV-2/FLU/RSV assay is intended as an aid in  the diagnosis of influenza from Nasopharyngeal swab specimens and  should not be used as a sole basis for treatment. Nasal washings and  aspirates are unacceptable for Xpert Xpress SARS-CoV-2/FLU/RSV  testing.  Fact Sheet for Patients: PinkCheek.be  Fact Sheet for Healthcare Providers: GravelBags.it  This test is not yet approved or cleared by the [place] FDA and  has been authorized for detection and/or diagnosis of SARS-CoV-2 by  FDA under an Emergency Use Authorization (EUA). This EUA will remain  in effect (meaning this test can be used) for the duration of the  Covid-19 declaration under Section 564(b)(1) of the Act, 21  U.S.C. section 360bbb-3(b)(1), unless the authorization is  terminated or revoked. Performed at [facility], [address], [place], [place] [address]          Radiology Studies: No results found.      Scheduled Meds:  apixaban  10 mg Oral BID   Followed by   apixaban  5 mg Oral BID   Chlorhexidine Gluconate Cloth  6 each Topical Daily   docusate sodium  100 mg Oral BID   donepezil  10 mg Oral QHS   folic acid  1 mg Oral Daily   insulin aspart  0-5 Units Subcutaneous QHS   insulin aspart  0-9 Units Subcutaneous TID WC   insulin aspart  3 Units Subcutaneous TID WC   insulin glargine  14 Units Subcutaneous Daily   multivitamin with minerals  1 tablet Oral Daily   polyethylene glycol  17 g Oral Daily    pyridOXINE  50 mg Oral Daily   tamsulosin  0.4 mg Oral QPC supper   traZODone  50 mg Oral QHS   Continuous Infusions:  sodium chloride 10 mL/hr at 03/27/20 0631     LOS: 9 days    Time spent: 28 minutes    Sharen Hones, MD [facility]   To contact the attending provider between 7A-7P or the covering provider during  after hours 7P-7A, please log into the web site [email or web address] and access using universal [place] password for that web site. If you do not have the password, please call the hospital operator.  03/29/2020, 8:22 AM

## 2020-03-29 NOTE — Progress Notes (Signed)
Nurse attempted to place a new foley for the one patient pulled out. Patient is too physical, hitting, kicking, and yelling. Nurse will attempt at a later time.

## 2020-03-29 NOTE — Discharge Instructions (Signed)

## 2020-03-30 DIAGNOSIS — R338 Other retention of urine: Secondary | ICD-10-CM | POA: Diagnosis not present

## 2020-03-30 DIAGNOSIS — N390 Urinary tract infection, site not specified: Secondary | ICD-10-CM | POA: Diagnosis not present

## 2020-03-30 DIAGNOSIS — G9341 Metabolic encephalopathy: Secondary | ICD-10-CM | POA: Diagnosis not present

## 2020-03-30 DIAGNOSIS — I82433 Acute embolism and thrombosis of popliteal vein, bilateral: Secondary | ICD-10-CM | POA: Diagnosis not present

## 2020-03-30 LAB — GLUCOSE, CAPILLARY
Glucose-Capillary: 129 mg/dL — ABNORMAL HIGH (ref 70–99)
Glucose-Capillary: 170 mg/dL — ABNORMAL HIGH (ref 70–99)
Glucose-Capillary: 151 mg/dL — ABNORMAL HIGH (ref 70–99)
Glucose-Capillary: 78 mg/dL (ref 70–99)

## 2020-03-30 NOTE — Progress Notes (Signed)
PROGRESS NOTE    Carol Curtis  YDX:[id number] DOB: 04/16/1936 DOA: 03/20/2020 PCP: Or[facility]   Chief complaint: Fall Brief Narrative:  Patient is 84 year old female with history of dementia, type 2 diabetes, history of left breast cancer who was brought to the emergency room afteran unwitnessed fall. She is placed on meropenem for urinary tract infection with E. coli ESBL.  10/7.Patient completed about 6 days of meropenem. She continued to have urinary retention, Foley catheter was anchored. Will give fosfomycin 1 dose.   Assessment & Plan:   Principal Problem:   Acute metabolic encephalopathy Active Problems:   Dementia ([place])   UTI due to extended-spectrum beta lactamase (ESBL) producing Escherichia coli   Fall   Weakness   Type 2 diabetes mellitus with hyperlipidemia (HCC)   Right leg swelling   Acute deep vein thrombosis (DVT) of popliteal vein of both lower extremities (HCC)   Acute urinary retention   Constipation  #1.  Acute metabolic encephalopathy with E. coli ESBL urinary tract infection. Condition improved.  Mental status at baseline.  Received 6 days of meropenem, 1 dose of fosfomycin.  2.  Urinary retention. Continue Foley catheter, and Flomax.  3.  Extensive bilateral lower extremity DVT. Continue Eliquis.  4.  Type 2 diabetes uncontrolled with hyperglycemia. Continue current regimen.  5.  Dementia with behavioral disturbance. Patient condition is stable.  Aricept discontinued.  6.  Constipation. Spoke with RN, patient refused lactulose and MiraLAX yesterday, no recorded bowel movement.  We will give him tap water enema.    DVT prophylaxis: Eliquis Code Status: Full Family Communication: Patient POA updated.  .   Status is: Inpatient  Remains inpatient appropriate because:Unsafe d/c plan   Dispo: The patient is from: SNF              Anticipated d/c is to: SNF              Anticipated d/c date is: 1 day               Patient currently is medically stable to d/c.        I/O last 3 completed shifts: In: -  Out: 320 [Urine:320] No intake/output data recorded.     Consultants:   None  Procedures: None  Antimicrobials: None  Subjective: Patient condition stable.  He refused lactulose and MiraLAX yesterday, no bowel movement for a week. She has good appetite, no nausea vomiting. No shortness of breath or cough. No fever or chills.  Objective: Vitals:   03/29/20 0742 03/29/20 1558 03/29/20 1603 03/30/20 0051  BP: (!) 157/51  (!) 149/79 (!) 124/49  Pulse: (!) 58 78 69 72  Resp: 17 16 19 17   Temp: 98.2 F (36.8 C) 98.6 F (37 C) 98.8 F (37.1 C) 98.1 F (36.7 C)  TempSrc: Oral Oral Oral Oral  SpO2: 100%  98% 98%  Weight:      Height:        Intake/Output Summary (Last 24 hours) at 03/30/2020 0812 Last data filed at 03/30/2020 0054 Gross per 24 hour  Intake --  Output 150 ml  Net -150 ml   Filed Weights   03/20/20 0605  Weight: 72.6 kg    Examination:  General exam: Appears calm and comfortable  Respiratory system: Clear to auscultation. Respiratory effort normal. Cardiovascular system: S1 & S2 heard, RRR. No JVD, murmurs, rubs, gallops or clicks. 1+ bilateral lower extremity edema. Gastrointestinal system: Abdomen is nondistended, soft and nontender. No organomegaly or  masses felt. Normal bowel sounds heard. Central nervous system: Alert and oriented x1. No focal neurological deficits. Extremities: Symmetric 5 x 5 power. Skin: No rashes, lesions or ulcers Psychiatry: Mood & affect appropriate.     Data Reviewed: I have personally reviewed following labs and imaging studies  CBC: Recent Labs  Lab 03/24/20 0257 03/25/20 0256 03/26/20 0252  WBC 7.8 9.6 10.5  HGB 9.5* 10.6* 10.3*  HCT 30.6* 32.1* 32.9*  MCV 80.1 77.5* 79.5*  PLT 284 381 751   Basic Metabolic Panel: Recent Labs  Lab 03/24/20 0257 03/25/20 0256 03/26/20 0252  NA 141 138 136  K 4.0 4.5  4.1  CL 104 103 102  CO2 29 26 25   GLUCOSE 183* 188* 217*  BUN 10 7* 12  CREATININE 0.67 0.72 0.80  CALCIUM 8.6* 8.7* 8.9   GFR: Estimated Creatinine Clearance: 53 mL/min (by C-G formula based on SCr of 0.8 mg/dL). Liver Function Tests: Recent Labs  Lab 03/25/20 0256  AST 23  ALT 9  ALKPHOS 84  BILITOT 0.7  PROT 7.0  ALBUMIN 2.5*   No results for input(s): LIPASE, AMYLASE in the last 168 hours. No results for input(s): AMMONIA in the last 168 hours. Coagulation Profile: No results for input(s): INR, PROTIME in the last 168 hours. Cardiac Enzymes: No results for input(s): CKTOTAL, CKMB, CKMBINDEX, TROPONINI in the last 168 hours. BNP (last 3 results) No results for input(s): PROBNP in the last 8760 hours. HbA1C: No results for input(s): HGBA1C in the last 72 hours. CBG: Recent Labs  Lab 03/28/20 1637 03/28/20 2118 03/29/20 1146 03/29/20 1551 03/29/20 2132  GLUCAP 135* 127* 164* 144* 164*   Lipid Profile: No results for input(s): CHOL, HDL, LDLCALC, TRIG, CHOLHDL, LDLDIRECT in the last 72 hours. Thyroid Function Tests: No results for input(s): TSH, T4TOTAL, FREET4, T3FREE, THYROIDAB in the last 72 hours. Anemia Panel: No results for input(s): VITAMINB12, FOLATE, FERRITIN, TIBC, IRON, RETICCTPCT in the last 72 hours. Sepsis Labs: No results for input(s): PROCALCITON, LATICACIDVEN in the last 168 hours.  Recent Results (from the past 240 hour(s))  Respiratory Panel by RT PCR (Flu A&B, Covid) - Nasopharyngeal Swab     Status: None   Collection Time: 03/20/20 10:21 AM   Specimen: Nasopharyngeal Swab  Result Value Ref Range Status   SARS Coronavirus 2 by RT PCR NEGATIVE NEGATIVE Final    Comment: (NOTE) SARS-CoV-2 target nucleic acids are NOT DETECTED.  The SARS-CoV-2 RNA is generally detectable in upper respiratoy specimens during the acute phase of infection. The lowest concentration of SARS-CoV-2 viral copies this assay can detect is 131 copies/mL. A negative  result does not preclude SARS-Cov-2 infection and should not be used as the sole basis for treatment or other patient management decisions. A negative result may occur with  improper specimen collection/handling, submission of specimen other than nasopharyngeal swab, presence of viral mutation(s) within the areas targeted by this assay, and inadequate number of viral copies (<131 copies/mL). A negative result must be combined with clinical observations, patient history, and epidemiological information. The expected result is Negative.  Fact Sheet for Patients:  PinkCheek.be  Fact Sheet for Healthcare Providers:  GravelBags.it  This test is no t yet approved or cleared by the [place] FDA and  has been authorized for detection and/or diagnosis of SARS-CoV-2 by FDA under an Emergency Use Authorization (EUA). This EUA will remain  in effect (meaning this test can be used) for the duration of the COVID-19 declaration under Section 564(b)(1)  of the Act, 21 U.S.C. section 360bbb-3(b)(1), unless the authorization is terminated or revoked sooner.     Influenza A by PCR NEGATIVE NEGATIVE Final   Influenza B by PCR NEGATIVE NEGATIVE Final    Comment: (NOTE) The Xpert Xpress SARS-CoV-2/FLU/RSV assay is intended as an aid in  the diagnosis of influenza from Nasopharyngeal swab specimens and  should not be used as a sole basis for treatment. Nasal washings and  aspirates are unacceptable for Xpert Xpress SARS-CoV-2/FLU/RSV  testing.  Fact Sheet for Patients: PinkCheek.be  Fact Sheet for Healthcare Providers: GravelBags.it  This test is not yet approved or cleared by the [place] FDA and  has been authorized for detection and/or diagnosis of SARS-CoV-2 by  FDA under an Emergency Use Authorization (EUA). This EUA will remain  in effect (meaning this test can be used)  for the duration of the  Covid-19 declaration under Section 564(b)(1) of the Act, 21  U.S.C. section 360bbb-3(b)(1), unless the authorization is  terminated or revoked. Performed at [facility], [address] [place], [place] [address]          Radiology Studies: No results found.      Scheduled Meds: . apixaban  5 mg Oral BID  . Chlorhexidine Gluconate Cloth  6 each Topical Daily  . docusate sodium  100 mg Oral BID  . donepezil  10 mg Oral QHS  . folic acid  1 mg Oral Daily  . insulin aspart  0-5 Units Subcutaneous QHS  . insulin aspart  0-9 Units Subcutaneous TID WC  . insulin aspart  3 Units Subcutaneous TID WC  . insulin glargine  14 Units Subcutaneous Daily  . lactulose  20 g Oral Once  . multivitamin with minerals  1 tablet Oral Daily  . polyethylene glycol  17 g Oral Daily  . pyridOXINE  50 mg Oral Daily  . tamsulosin  0.4 mg Oral QPC supper  . traZODone  50 mg Oral QHS   Continuous Infusions: . sodium chloride 10 mL/hr at 03/27/20 0631     LOS: 10 days    Time spent: 26 minutes    Sharen Hones, MD [facility]   To contact the attending provider between 7A-7P or the covering provider during after hours 7P-7A, please log into the web site [email or web address] and access using universal [place] password for that web site. If you do not have the password, please call the hospital operator.  03/30/2020, 8:12 AM

## 2020-03-31 DIAGNOSIS — G309 Alzheimer's disease, unspecified: Secondary | ICD-10-CM | POA: Diagnosis not present

## 2020-03-31 DIAGNOSIS — G9341 Metabolic encephalopathy: Secondary | ICD-10-CM | POA: Diagnosis not present

## 2020-03-31 DIAGNOSIS — I82433 Acute embolism and thrombosis of popliteal vein, bilateral: Secondary | ICD-10-CM | POA: Diagnosis not present

## 2020-03-31 DIAGNOSIS — R338 Other retention of urine: Secondary | ICD-10-CM | POA: Diagnosis not present

## 2020-03-31 DIAGNOSIS — R4182 Altered mental status, unspecified: Secondary | ICD-10-CM | POA: Diagnosis not present

## 2020-03-31 DIAGNOSIS — N3000 Acute cystitis without hematuria: Secondary | ICD-10-CM | POA: Diagnosis not present

## 2020-03-31 LAB — GLUCOSE, CAPILLARY
Glucose-Capillary: 88 mg/dL (ref 70–99)
Glucose-Capillary: 114 mg/dL — ABNORMAL HIGH (ref 70–99)
Glucose-Capillary: 164 mg/dL — ABNORMAL HIGH (ref 70–99)
Glucose-Capillary: 198 mg/dL — ABNORMAL HIGH (ref 70–99)

## 2020-03-31 LAB — SARS CORONAVIRUS 2 BY RT PCR (HOSPITAL ORDER, PERFORMED IN ~~LOC~~ HOSPITAL LAB): SARS Coronavirus 2: NEGATIVE

## 2020-03-31 MED ORDER — HALOPERIDOL LACTATE 5 MG/ML IJ SOLN
5.0000 mg | Freq: Once | INTRAMUSCULAR | Status: AC
  Administered 2020-03-31: 5 mg via INTRAMUSCULAR
  Filled 2020-03-31: qty 1

## 2020-03-31 MED ORDER — LACTULOSE 10 GM/15ML PO SOLN
20.0000 g | Freq: Once | ORAL | Status: AC
  Administered 2020-03-31: 20 g via ORAL
  Filled 2020-03-31: qty 30

## 2020-03-31 MED ORDER — BISACODYL 10 MG RE SUPP
10.0000 mg | Freq: Once | RECTAL | Status: AC
  Administered 2020-03-31: 10 mg via RECTAL
  Filled 2020-03-31: qty 1

## 2020-03-31 MED ORDER — APIXABAN 5 MG PO TABS: 5.0000 mg | ORAL_TABLET | Freq: Two times a day (BID) | ORAL | 0 refills | Status: AC

## 2020-03-31 MED ORDER — HALOPERIDOL LACTATE 5 MG/ML IJ SOLN
2.0000 mg | Freq: Once | INTRAMUSCULAR | Status: DC
  Filled 2020-03-31: qty 1

## 2020-03-31 NOTE — Discharge Summary (Signed)
Physician Discharge Summary  Patient ID: Carol Curtis MRN: [id number] DOB/AGE: 01-08-1936 84 y.o.  Admit date: 03/20/2020 Discharge date: 03/31/2020  Admission Diagnoses:  Discharge Diagnoses:  Principal Problem:   Acute metabolic encephalopathy Active Problems:   Dementia ([place])   UTI due to extended-spectrum beta lactamase (ESBL) producing Escherichia coli   Fall   Weakness   Type 2 diabetes mellitus with hyperlipidemia (HCC)   Right leg swelling   Acute deep vein thrombosis (DVT) of popliteal vein of both lower extremities (HCC)   Acute urinary retention   Constipation   Discharged Condition: good  Hospital Course:  Patient is 84 year old female with history of dementia, type 2 diabetes, history of left breast cancer who was brought to the emergency room afteran unwitnessed fall. She is placed on meropenem for urinary tract infection with E. coli ESBL.  10/7.Patient completed about 6 days of meropenem. She continued to have urinary retention, Foley catheter was anchored. Will give fosfomycin 1 dose.  #1.  Acute metabolic encephalopathy with E. coli ESBL urinary tract infection. Condition improved.  Mental status at baseline.  Received 6 days of meropenem, 1 dose of fosfomycin.  2.  Urinary retention. Continue Foley catheter, and Flomax. We will schedule follow-up with urology as outpatient.  3.  Extensive bilateral lower extremity DVT. Continue Eliquis.  4.  Type 2 diabetes uncontrolled with hyperglycemia. Continue current regimen.  5.  Dementia with behavioral disturbance. Patient condition is stable.   6.  Constipation. Received enema yesterday, patient refused to take any oral stool softener.  Will make sure she has a bowel movement before discharge.   Consults: None  Significant Diagnostic Studies:   Treatments: Meropenem, fosfomycin  Discharge Exam: Blood pressure (!) 139/52, pulse 66, temperature 98 F (36.7 C), temperature source  Axillary, resp. rate 16, height 5\' 4"  (1.626 m), weight 72.6 kg, SpO2 99 %. General appearance: alert, cooperative and Confused, oriented to herself only. Resp: clear to auscultation bilaterally Cardio: regular rate and rhythm, S1, S2 normal, no murmur, click, rub or gallop GI: soft, non-tender; bowel sounds normal; no masses,  no organomegaly Extremities: 1+ edema at bilateral LE  Disposition: Discharge disposition: 03-Skilled Nursing Facility       Discharge Instructions    Diet - low sodium heart healthy   Complete by: As directed    Increase activity slowly   Complete by: As directed    Increase activity slowly   Complete by: As directed      Allergies as of 03/31/2020   No Known Allergies     Medication List    TAKE these medications   amLODipine 5 MG tablet Commonly known as: NORVASC Take 5 mg by mouth daily.   apixaban 5 MG Tabs tablet Commonly known as: ELIQUIS Take 1 tablet (5 mg total) by mouth 2 (two) times daily.   aspirin 81 MG chewable tablet Chew 81 mg by mouth daily.   atorvastatin 20 MG tablet Commonly known as: LIPITOR Take 20 mg by mouth daily.   B-complex with vitamin C tablet Take 1 tablet by mouth daily.   Calcium-Magnesium-Zinc 334-134-5 MG Tabs Take 1 tablet by mouth every evening.   Centrum Silver 50+Women Tabs Take 1 tablet by mouth daily. What changed: Another medication with the same name was removed. Continue taking this medication, and follow the directions you see here.   donepezil 10 MG tablet Commonly known as: ARICEPT Take 10 mg by mouth at bedtime.   folic acid 1 MG tablet Commonly known  as: FOLVITE Take 1 mg by mouth daily.   furosemide 20 MG tablet Commonly known as: LASIX Take 20 mg by mouth. What changed: Another medication with the same name was removed. Continue taking this medication, and follow the directions you see here.   insulin lispro protamine-lispro (75-25) 100 UNIT/ML Susp injection Commonly known  as: HUMALOG 75/25 MIX Inject 10-15 Units into the skin 2 (two) times daily with a meal.   metFORMIN 1000 MG tablet Commonly known as: GLUCOPHAGE Take 1,000 mg by mouth 2 (two) times daily with a meal.   pyridOXINE 50 MG tablet Commonly known as: VITAMIN B-6 Take 50 mg by mouth daily.   tamsulosin 0.4 MG Caps capsule Commonly known as: FLOMAX Take 1 capsule (0.4 mg total) by mouth daily after supper.   traZODone 50 MG tablet Commonly known as: DESYREL Take 50 mg by mouth at bedtime.     ASK your doctor about these medications   fosfomycin 3 g Pack Commonly known as: MONUROL Take 3 g by mouth once for 1 dose. Ask about: Should I take this medication?       Contact information for follow-up providers    Stoioff, Ronda Fairly, MD In 1 week.   Specialty: Urology Why: October 20 @ 1:30p Contact information: [address] [place] [address] [phone]        [facility] Follow up in 1 week(s).   Specialty: Geriatric Medicine Contact information: [address] [place] [address] [phone]            Contact information for after-discharge care    Destination    [facility] Preferred SNF .   Service: Skilled Nursing Contact information: [address] [place] [phone]                  Signed: Sharen Hones 03/31/2020, 8:20 AM

## 2020-03-31 NOTE — Care Management Important Message (Signed)
Important Message  Patient Details  Name: Carol Curtis MRN: [id number] Date of Birth: 10-Aug-1935   Medicare Important Message Given:  Yes  Reviewed with Marylene Buerger via phone.     Dannette Barbara 03/31/2020, 2:00 PM

## 2020-04-01 DIAGNOSIS — B9629 Other Escherichia coli [E. coli] as the cause of diseases classified elsewhere: Secondary | ICD-10-CM | POA: Diagnosis not present

## 2020-04-01 DIAGNOSIS — N390 Urinary tract infection, site not specified: Secondary | ICD-10-CM | POA: Diagnosis not present

## 2020-04-01 DIAGNOSIS — R338 Other retention of urine: Secondary | ICD-10-CM | POA: Diagnosis not present

## 2020-04-01 DIAGNOSIS — I82433 Acute embolism and thrombosis of popliteal vein, bilateral: Secondary | ICD-10-CM | POA: Diagnosis not present

## 2020-04-01 LAB — CBC
HCT: 35.6 % — ABNORMAL LOW (ref 36.0–46.0)
Hemoglobin: 10.8 g/dL — ABNORMAL LOW (ref 12.0–15.0)
MCH: 24.6 pg — ABNORMAL LOW (ref 26.0–34.0)
MCHC: 30.3 g/dL (ref 30.0–36.0)
MCV: 81.1 fL (ref 80.0–100.0)
Platelets: 418 10*3/uL — ABNORMAL HIGH (ref 150–400)
RBC: 4.39 MIL/uL (ref 3.87–5.11)
RDW: 14.6 % (ref 11.5–15.5)
WBC: 8.6 10*3/uL (ref 4.0–10.5)
nRBC: 0 % (ref 0.0–0.2)

## 2020-04-01 LAB — BASIC METABOLIC PANEL
Anion gap: 12 (ref 5–15)
BUN: 7 mg/dL — ABNORMAL LOW (ref 8–23)
CO2: 23 mmol/L (ref 22–32)
Calcium: 8.8 mg/dL — ABNORMAL LOW (ref 8.9–10.3)
Chloride: 104 mmol/L (ref 98–111)
Creatinine, Ser: 0.76 mg/dL (ref 0.44–1.00)
GFR, Estimated: >60 mL/min (ref >60)
Glucose, Bld: 210 mg/dL — ABNORMAL HIGH (ref 70–99)
Potassium: 3.7 mmol/L (ref 3.5–5.1)
Sodium: 139 mmol/L (ref 135–145)

## 2020-04-01 LAB — GLUCOSE, CAPILLARY
Glucose-Capillary: 216 mg/dL — ABNORMAL HIGH (ref 70–99)
Glucose-Capillary: 293 mg/dL — ABNORMAL HIGH (ref 70–99)

## 2020-04-01 MED ORDER — LACTULOSE 20 GM/30ML PO SOLN: 20.0000 g | Freq: Every day | ORAL | 0 refills | Status: DC | PRN

## 2020-04-01 MED ORDER — SENNA 8.6 MG PO TABS: 2.0000 | ORAL_TABLET | Freq: Two times a day (BID) | ORAL | 0 refills | Status: DC

## 2020-04-01 MED ORDER — HALOPERIDOL LACTATE 5 MG/ML IJ SOLN
2.0000 mg | Freq: Once | INTRAMUSCULAR | Status: DC
  Filled 2020-04-01: qty 1

## 2020-04-01 NOTE — Discharge Summary (Signed)
Physician Discharge Summary  Patient ID: Carol Curtis MRN: [id number] DOB/AGE: September 06, 1935 84 y.o.  Admit date: 03/20/2020 Discharge date: 04/01/2020  Admission Diagnoses:  Discharge Diagnoses:  Principal Problem:   Acute metabolic encephalopathy Active Problems:   Dementia ([place])   UTI due to extended-spectrum beta lactamase (ESBL) producing Escherichia coli   Fall   Weakness   Type 2 diabetes mellitus with hyperlipidemia (HCC)   Right leg swelling   Acute deep vein thrombosis (DVT) of popliteal vein of both lower extremities (HCC)   Acute urinary retention   Constipation   Discharged Condition: good  Hospital Course:  Patient is 84 year old female with history of dementia, type 2 diabetes, history of left breast cancer who was brought to the emergency room afteran unwitnessed fall. She is placed on meropenem for urinary tract infection with E. coli ESBL.  10/7.Patient completed about 6 days of meropenem. She continued to have urinary retention, Foley catheter was anchored. Gave fosfomycin 1 dose.  10/11.  Try to discharge patient.  However, patient developed delirium and agitation due to constipation, and abdominal discomfort after giving multiple dose of lactulose.    10/12.  Patient had multiple stools since yesterday.  Currently her condition had improved.  No agitation.  Medically stable to be discharged.  #1. Acute metabolic encephalopathy with E. coli ESBL urinary tract infection. Condition improved. Mental status at baseline. Received 6 days of meropenem, 1 dose of fosfomycin.  2. Urinary retention. Continue Flomax for now.  Patient removed her Foley catheter yesterday during the episode of delirium.  Since then, she has been able to urinate.  Last bladder scan was 80 mL I have confirmed with the nurse.  She no longer need a Foley catheter.  She will be followed by urology as outpatient.  3.Extensive bilateral lower extremity DVT. Continue  Eliquis.  4. Type 2 diabetes uncontrolled with hyperglycemia. Continue current regimen.  5. Dementia with behavioral disturbance. Patient condition is stable.   6. Constipation. Patient had multiple stools since yesterday.  Added stool softener to the discharge medication.   Consults: None  Significant Diagnostic Studies:   Treatments: Meropenem, fosfomycin  Discharge Exam: Blood pressure (!) 156/69, pulse 85, temperature 99 F (37.2 C), temperature source Axillary, resp. rate 18, height 5\' 4"  (1.626 m), weight 72.6 kg, SpO2 96 %. General appearance: alert, cooperative and Oriented to herself. Resp: clear to auscultation bilaterally Cardio: regular rate and rhythm, S1, S2 normal, no murmur, click, rub or gallop GI: soft, non-tender; bowel sounds normal; no masses,  no organomegaly Extremities: edema 1+  Disposition: Discharge disposition: 03-Skilled Nursing Facility       Discharge Instructions    Diet - low sodium heart healthy   Complete by: As directed    Increase activity slowly   Complete by: As directed    Increase activity slowly   Complete by: As directed    Increase activity slowly   Complete by: As directed      Allergies as of 04/01/2020   No Known Allergies     Medication List    TAKE these medications   amLODipine 5 MG tablet Commonly known as: NORVASC Take 5 mg by mouth daily.   apixaban 5 MG Tabs tablet Commonly known as: ELIQUIS Take 1 tablet (5 mg total) by mouth 2 (two) times daily.   aspirin 81 MG chewable tablet Chew 81 mg by mouth daily.   atorvastatin 20 MG tablet Commonly known as: LIPITOR Take 20 mg by mouth daily.  B-complex with vitamin C tablet Take 1 tablet by mouth daily.   Calcium-Magnesium-Zinc 334-134-5 MG Tabs Take 1 tablet by mouth every evening.   Centrum Silver 50+Women Tabs Take 1 tablet by mouth daily. What changed: Another medication with the same name was removed. Continue taking this medication,  and follow the directions you see here.   donepezil 10 MG tablet Commonly known as: ARICEPT Take 10 mg by mouth at bedtime.   folic acid 1 MG tablet Commonly known as: FOLVITE Take 1 mg by mouth daily.   furosemide 20 MG tablet Commonly known as: LASIX Take 20 mg by mouth. What changed: Another medication with the same name was removed. Continue taking this medication, and follow the directions you see here.   insulin lispro protamine-lispro (75-25) 100 UNIT/ML Susp injection Commonly known as: HUMALOG 75/25 MIX Inject 10-15 Units into the skin 2 (two) times daily with a meal.   Lactulose 20 GM/30ML Soln Take 30 mLs (20 g total) by mouth daily as needed.   metFORMIN 1000 MG tablet Commonly known as: GLUCOPHAGE Take 1,000 mg by mouth 2 (two) times daily with a meal.   pyridOXINE 50 MG tablet Commonly known as: VITAMIN B-6 Take 50 mg by mouth daily.   senna 8.6 MG Tabs tablet Commonly known as: SENOKOT Take 2 tablets (17.2 mg total) by mouth in the morning and at bedtime.   tamsulosin 0.4 MG Caps capsule Commonly known as: FLOMAX Take 1 capsule (0.4 mg total) by mouth daily after supper.   traZODone 50 MG tablet Commonly known as: DESYREL Take 50 mg by mouth at bedtime.       Contact information for follow-up providers    Stoioff, Ronda Fairly, MD In 1 week.   Specialty: Urology Why: October 20 @ 1:30p Contact information: [place] [address] [phone]        [place] [place] [facility] In 1 week.   Specialty: Geriatric Medicine Why: Facility will make Appt.  Contact information: [place] [address] [phone]            Contact information for after-discharge care    Destination    [facility] Preferred SNF .   Service: Skilled Nursing Contact information: [address] [place] [phone]                  Signed: Sharen Hones 04/01/2020, 9:29 AM

## 2020-04-01 NOTE — Progress Notes (Signed)
Report called to [facility]

## 2020-04-09 ENCOUNTER — Ambulatory Visit (INDEPENDENT_AMBULATORY_CARE_PROVIDER_SITE_OTHER): Payer: Medicare PPO | Admitting: Urology

## 2020-04-09 ENCOUNTER — Other Ambulatory Visit: Payer: Self-pay

## 2020-04-09 ENCOUNTER — Encounter: Payer: Self-pay | Admitting: Urology

## 2020-04-09 VITALS — BP 118/62 | HR 72 | Ht 64.0 in | Wt 160.0 lb

## 2020-04-09 DIAGNOSIS — N390 Urinary tract infection, site not specified: Secondary | ICD-10-CM | POA: Diagnosis not present

## 2020-04-09 LAB — BLADDER SCAN AMB NON-IMAGING: Scan Result: 300

## 2020-04-09 NOTE — Progress Notes (Signed)
04/09/2020 1:30 PM   Carol Curtis 1935/11/23 [id number]  Referring provider: [facility] [place],  [place] [address]  Chief Complaint  Patient presents with  . Recurrent UTI    HPI: Carol Curtis is an 84 y.o. female who presents for hospital follow-up of a recent UTI.   Admitted [facility] 03/20/2020 for acute metabolic encephalopathy and UTI secondary to ESBL E. Coli  Was noted to have urinary retention and a Foley catheter was placed  She has dementia and removed her Foley towards the end of her hospitalization and was voiding spontaneously with out significant residual  Discharge to skilled nursing facility  She is nonverbal and presents today with staff from her SNF  Staff reports no problems since hospital discharge  Unsure when she last voided  CT performed during her hospitalization showed no hydronephrosis or renal mass.  There were scattered parenchymal calcifications in the right kidney   PMH: Past Medical History:  Diagnosis Date  . Breast cancer ([place])    left breast ca  . Dementia ([place])   . Diabetes mellitus without complication [facility])     Surgical History: No past surgical history on file.  Home Medications:  Allergies as of 04/09/2020   No Known Allergies     Medication List       Accurate as of April 09, 2020  1:30 PM. If you have any questions, ask your nurse or doctor.        amLODipine 5 MG tablet Commonly known as: NORVASC Take 5 mg by mouth daily.   apixaban 5 MG Tabs tablet Commonly known as: ELIQUIS Take 1 tablet (5 mg total) by mouth 2 (two) times daily.   aspirin 81 MG chewable tablet Chew 81 mg by mouth daily.   atorvastatin 20 MG tablet Commonly known as: LIPITOR Take 20 mg by mouth daily.   B-complex with vitamin C tablet Take 1 tablet by mouth daily.   Calcium-Magnesium-Zinc 334-134-5 MG Tabs Take 1 tablet by mouth every evening.   Centrum Silver 50+Women Tabs Take 1  tablet by mouth daily.   donepezil 10 MG tablet Commonly known as: ARICEPT Take 10 mg by mouth at bedtime.   folic acid 1 MG tablet Commonly known as: FOLVITE Take 1 mg by mouth daily.   furosemide 20 MG tablet Commonly known as: LASIX Take 20 mg by mouth.   insulin lispro protamine-lispro (75-25) 100 UNIT/ML Susp injection Commonly known as: HUMALOG 75/25 MIX Inject 10-15 Units into the skin 2 (two) times daily with a meal.   Lactulose 20 GM/30ML Soln Take 30 mLs (20 g total) by mouth daily as needed.   metFORMIN 1000 MG tablet Commonly known as: GLUCOPHAGE Take 1,000 mg by mouth 2 (two) times daily with a meal.   pyridOXINE 50 MG tablet Commonly known as: VITAMIN B-6 Take 50 mg by mouth daily.   senna 8.6 MG Tabs tablet Commonly known as: SENOKOT Take 2 tablets (17.2 mg total) by mouth in the morning and at bedtime.   tamsulosin 0.4 MG Caps capsule Commonly known as: FLOMAX Take 1 capsule (0.4 mg total) by mouth daily after supper.   traZODone 50 MG tablet Commonly known as: DESYREL Take 50 mg by mouth at bedtime.       Allergies: No Known Allergies  Family History: No family history on file.  Social History:  has an unknown smoking status. She has never used smokeless tobacco. She reports that she does not  drink alcohol and does not use drugs.   Physical Exam: BP 118/62   Pulse 72   Ht 5\' 4"  (1.626 m)   Wt 160 lb (72.6 kg)   BMI 27.46 kg/m   Constitutional:  Alert, nonverbal, No acute distress. HEENT: [place] AT, moist mucus membranes.  Trachea midline, no masses. Cardiovascular: No clubbing, cyanosis, or edema. Respiratory: Normal respiratory effort, no increased work of breathing. GI: Abdomen is soft, nontender, nondistended, no abdominal masses GU: No CVA tenderness   Assessment & Plan:    1. Urinary tract infection without hematuria, site unspecified  Recent hospitalization for UTI  Bladder scan volume estimated at 300 mL, unclear when she  last voided  Recheck bladder scan 1 month    Abbie Sons, MD  [place] [address], [place],  [address] [phone]

## 2020-04-09 NOTE — Patient Instructions (Signed)
Scheduled renal ultrasound

## 2020-04-11 ENCOUNTER — Encounter: Payer: Self-pay | Admitting: Urology

## 2020-04-11 ENCOUNTER — Other Ambulatory Visit: Payer: Self-pay

## 2020-04-11 ENCOUNTER — Non-Acute Institutional Stay: Payer: Medicare PPO | Admitting: Primary Care

## 2020-04-11 DIAGNOSIS — N390 Urinary tract infection, site not specified: Secondary | ICD-10-CM

## 2020-04-11 DIAGNOSIS — E785 Hyperlipidemia, unspecified: Secondary | ICD-10-CM

## 2020-04-11 DIAGNOSIS — G9341 Metabolic encephalopathy: Secondary | ICD-10-CM

## 2020-04-11 DIAGNOSIS — I82433 Acute embolism and thrombosis of popliteal vein, bilateral: Secondary | ICD-10-CM

## 2020-04-11 DIAGNOSIS — Z515 Encounter for palliative care: Secondary | ICD-10-CM

## 2020-04-11 DIAGNOSIS — E1169 Type 2 diabetes mellitus with other specified complication: Secondary | ICD-10-CM

## 2020-04-11 DIAGNOSIS — B9629 Other Escherichia coli [E. coli] as the cause of diseases classified elsewhere: Secondary | ICD-10-CM

## 2020-04-11 NOTE — Progress Notes (Signed)
[facility] Consult Note Telephone: [phone]  Fax: [phone]  PATIENT NAME: [place] Curtis [id number] [phone] (home)  DOB: 08-Aug-1935 MRN: [id number]  PRIMARY CARE PROVIDER:    Juluis Pitch, MD,  [address] [place] [address] [phone]  REFERRING PROVIDER:   Juluis Pitch, MD [address] [place],  [place] [address] [phone]  RESPONSIBLE PARTY:   Extended Emergency Contact Information Primary Emergency Contact: Carol Curtis Mobile Phone: [phone] Relation: Other  I met face to face with patient in the facility.  ASSESSMENT AND RECOMMENDATIONS:   1. Advance Care Planning/Goals of Care: Goals include to maximize quality of life and symptom management. I placed a call to her patient representative [place]. No answer, message left. I will continue to follow her for symptom management and goal of care clarification. I have requested her advance care plan records and will upload them as available.   2. Symptom Management:   I met with Carol Curtis in her nursing home room. She was sitting in a chair at her table. She was alert but not verbally interactive. She was not able to answer yes/ no questions or follow instructions. She did allow me to examine her.   Staff states that she has periods of not cooperating with treatments that seem to be correlated with low glucose levels. Her fasting glucose levels have been generally under 100. She recently had a decrease in her PM insulin which seem to be correlated with a fasting at 194 this week, but more values should be assessed. Staff states that her pill taking is poor when she has low glucose as it makes her more irritable and refuse po.   Perhaps if her sugars were a less tightly controlled she would be able to be managed on Metformin alone. Overall she eats well and her weights have been stable.   3. Follow up Palliative Care  Visit: Palliative care will continue to follow for goals of care clarification and symptom management. Return 4 weeks or prn.  4. Family /Caregiver/Community Supports: POA is Ms [place] Curtis, lives in [place].  5. Cognitive / Functional decline: A and O x 1, non verbal today. Dependent in all adls, iadls.  I spent 35 minutes providing this consultation,  from 1300 to 1335. More than 50% of the time in this consultation was spent coordinating communication.   CHIEF COMPLAINT: debility  HISTORY OF PRESENT ILLNESS:  [place] is a 84 y.o. year old female  with DM, advanced dementia . We are asked to consult around goals of care for advanced disease.    Palliative Care was asked to follow this patient by consultation request of [place] Pitch, MD to help address advance care planning and goals of care. This is the initial visit.  CODE STATUS: FULL  PPS: 30%  HOSPICE ELIGIBILITY/DIAGNOSIS: TBD  ROS/staff report  General: NAD ENMT: denies dysphagia Pulmonary: denies  cough, denies increased SOB,  Abdomen: endorses good appetite, endorses occ inconstipation, endorses incontinence of bowel, glucose usually 80s fasting GU: denies dysuria, endorses incontinence of urine MSK:  endorses ROM limitations, no falls reported Skin: endorses wound on l buttock Neurological: endorses weakness, denies pain, denies insomnia Psych: Endorses withdrawn mood  Physical Exam: Current and past weights: 162 lbs, stable Constitutional: NAD General :frail appearing, WNWD,  EYES: anicteric sclera, lids intact, no discharge  ENMT: intact hearing,oral mucous membranes moist, dentition intact CV: S1S2, RRR, no LE edema Pulmonary: LCTA,  no increased work of breathing, no cough, no audible wheezes, room air Abdomen: intake 25-50%,  no ascites GU: deferred MSK: mild sacropenia, decreased ROM in all extremities, no contractures of LE, non ambulatory Skin: warm and dry, no rashes or wounds on visible skin, staff  endorses buttock wound Neuro: Weakness, advanced cognitive impairment, grossly non -focal Psych: anxious affect, A and O x 1    CURRENT PROBLEM LIST:  Patient Active Problem List   Diagnosis Date Noted  . Constipation   . Acute deep vein thrombosis (DVT) of popliteal vein of both lower extremities (HCC)   . Acute urinary retention   . Right leg swelling   . Acute metabolic encephalopathy 24/23/5361  . UTI due to extended-spectrum beta lactamase (ESBL) producing Escherichia coli 03/20/2020  . Fall 03/20/2020  . Weakness 03/20/2020  . Type 2 diabetes mellitus with hyperlipidemia ([place]) 03/20/2020  . Dementia [facility])    PAST MEDICAL HISTORY:  Past Medical History:  Diagnosis Date  . Breast cancer ([place])    left breast ca  . Dementia ([place])   . Diabetes mellitus without complication ([place])     SOCIAL HX:  Social History   Tobacco Use  . Smoking status: Unknown If Ever Smoked  . Smokeless tobacco: Never Used  Substance Use Topics  . Alcohol use: Never   FAMILY HX: No family history on file.  ALLERGIES: No Known Allergies   PERTINENT MEDICATIONS:  Outpatient Encounter Medications as of 04/11/2020  Medication Sig  . amLODipine (NORVASC) 5 MG tablet Take 5 mg by mouth daily.  Marland Kitchen apixaban (ELIQUIS) 5 MG TABS tablet Take 1 tablet (5 mg total) by mouth 2 (two) times daily.  Marland Kitchen aspirin 81 MG chewable tablet Chew 81 mg by mouth daily.  Marland Kitchen atorvastatin (LIPITOR) 20 MG tablet Take 20 mg by mouth daily.  . B Complex-C (B-COMPLEX WITH VITAMIN C) tablet Take 1 tablet by mouth daily.  . Calcium-Magnesium-Zinc 334-134-5 MG TABS Take 1 tablet by mouth every evening.  . donepezil (ARICEPT) 10 MG tablet Take 10 mg by mouth at bedtime.  . folic acid (FOLVITE) 1 MG tablet Take 1 mg by mouth daily.  . furosemide (LASIX) 20 MG tablet Take 20 mg by mouth.  . insulin lispro protamine-lispro (HUMALOG 75/25 MIX) (75-25) 100 UNIT/ML SUSP injection Inject 10 Units into the skin daily with breakfast. Inject   11 units once a day at dinner time.  . metFORMIN (GLUCOPHAGE) 1000 MG tablet Take 1,000 mg by mouth 2 (two) times daily with a meal.  . Multiple Vitamins-Minerals (CENTRUM SILVER 50+WOMEN) TABS Take 1 tablet by mouth daily.  Marland Kitchen pyridOXINE (VITAMIN B-6) 50 MG tablet Take 50 mg by mouth daily.  . tamsulosin (FLOMAX) 0.4 MG CAPS capsule Take 1 capsule (0.4 mg total) by mouth daily after supper.  . traZODone (DESYREL) 50 MG tablet Take 50 mg by mouth at bedtime.  . [DISCONTINUED] Lactulose 20 GM/30ML SOLN Take 30 mLs (20 g total) by mouth daily as needed.  . [DISCONTINUED] senna (SENOKOT) 8.6 MG TABS tablet Take 2 tablets (17.2 mg total) by mouth in the morning and at bedtime.   No facility-administered encounter medications on file as of 04/11/2020.     Jason Coop, NP , DNP, MPH, AGPCNP-BC, ACHPN  COVID-19 PATIENT SCREENING TOOL  Person answering questions: ____________staff______ _____   1.  Is the patient or any family member in the home showing any signs or symptoms regarding respiratory infection?  Person with Symptom- __________NA_________________  a. Fever                                                                          Yes___ No___          ___________________  b. Shortness of breath                                                    Yes___ No___          ___________________ c. Cough/congestion                                       Yes___  No___         ___________________ d. Body aches/pains                                                         Yes___ No___        ____________________ e. Gastrointestinal symptoms (diarrhea, nausea)           Yes___ No___        ____________________  2. Within the past 14 days, has anyone living in the home had any contact with someone with or under investigation for COVID-19?    Yes___ No_X_   Person __________________   

## 2020-05-06 ENCOUNTER — Non-Acute Institutional Stay: Payer: Medicare PPO | Admitting: Primary Care

## 2020-05-06 ENCOUNTER — Other Ambulatory Visit: Payer: Self-pay

## 2020-05-06 DIAGNOSIS — E1169 Type 2 diabetes mellitus with other specified complication: Secondary | ICD-10-CM

## 2020-05-06 DIAGNOSIS — E785 Hyperlipidemia, unspecified: Secondary | ICD-10-CM

## 2020-05-06 DIAGNOSIS — Z515 Encounter for palliative care: Secondary | ICD-10-CM

## 2020-05-06 DIAGNOSIS — G9341 Metabolic encephalopathy: Secondary | ICD-10-CM

## 2020-05-06 NOTE — Progress Notes (Signed)
[facility] Consult Note Telephone: [phone]  Fax: [phone]     Date of encounter: 05/06/20 PATIENT NAME: [place] [id number] [phone] (home)  DOB: 1935/08/02 MRN: [id number]  PRIMARY CARE PROVIDER:    Rica Koyanagi, MD [address] [place],  [place] [address] [phone] REFERRING PROVIDER:   Rica Koyanagi, [place],  [place] [address] [phone]  RESPONSIBLE PARTY:   Extended Emergency Contact Information Primary Emergency Contact: Marylene Buerger Mobile Phone: [phone] Relation: Other  I met face to face with patient and family in  home/facility. Palliative Care was asked to follow this patient by consultation request of Rica Koyanagi, MD  to help address advance care planning and goals of care. This is a follow up  visit.   ASSESSMENT AND RECOMMENDATIONS:   1. Advance Care Planning/Goals of Care: Goals include to maximize quality of life and symptom management. Our advance care planning conversation included a discussion about:     The value and importance of advance care planning   Exploration of personal, cultural or spiritual beliefs that might influence medical decisions   POA requests ongoing PC following  2. Symptom Management:   I met with Mrs. Spike today at her nursing home. Staff reports that she has done well and she's completed her therapy. She stated today she was fine she was sitting up with friends. Fast score 7A, able to smile and support trunk and speak a few words. Her guardian verifies that she will be relocating to [facility] tomorrow. Her guardian wishes for her to have continued palliative following at the new facility, we will reach out to request referral. Staff states she is doing well and eating well no falls and no discomfort.   3. Follow up Palliative Care Visit: Palliative care  will continue to follow for goals of care clarification and symptom management. Return 4 weeks or prn.  4. Family /Caregiver/Community Supports: POA is friend, going to live in [facility]F [facility] from SNF.  5. Cognitive / Functional decline: A and O x 1, dementia, dependent In all adls, iadls.  I spent 15 minutes providing this consultation,  from 1400 to 1415. More than 50% of the time in this consultation was spent coordinating communication.   CODE STATUS: TBD  PPS: 40%  HOSPICE ELIGIBILITY/DIAGNOSIS: TBD  Subjective:  CHIEF COMPLAINT: dementia, weakness  HISTORY OF PRESENT ILLNESS:  Carol Curtis is a 84 y.o. year old female  with dementia, recent UTI, fall, debility .   We are asked to consult around sx management and advance care planning.    History obtained from review of EMR, discussion with primary team, and  interview with family, caregiver  and/or [name]. Records reviewed and summarized above.    CURRENT PROBLEM LIST:  Patient Active Problem List   Diagnosis Date Noted  . Constipation   . Acute deep vein thrombosis (DVT) of popliteal vein of both lower extremities (HCC)   . Acute urinary retention   . Right leg swelling   . Acute metabolic encephalopathy 45/80/9983  . UTI due to extended-spectrum beta lactamase (ESBL) producing Escherichia coli 03/20/2020  . Fall 03/20/2020  . Weakness 03/20/2020  . Type 2 diabetes mellitus with hyperlipidemia ([place]) 03/20/2020  . Dementia [facility])    PAST MEDICAL HISTORY:  Active Ambulatory Problems    Diagnosis Date Noted  . Dementia ([place])   . Acute metabolic  encephalopathy 03/20/2020  . UTI due to extended-spectrum beta lactamase (ESBL) producing Escherichia coli 03/20/2020  . Fall 03/20/2020  . Weakness 03/20/2020  . Type 2 diabetes mellitus with hyperlipidemia ([place]) 03/20/2020  . Right leg swelling   . Acute deep vein thrombosis (DVT) of popliteal vein of both lower extremities (HCC)   . Acute urinary retention   .  Constipation    Resolved Ambulatory Problems    Diagnosis Date Noted  . No Resolved Ambulatory Problems   Past Medical History:  Diagnosis Date  . Breast cancer ([place])   . Diabetes mellitus without complication ([place])    SOCIAL HX:  Social History   Tobacco Use  . Smoking status: Unknown If Ever Smoked  . Smokeless tobacco: Never Used  Substance Use Topics  . Alcohol use: Never   FAMILY HX: No family history on file.  ALLERGIES: No Known Allergies   PERTINENT MEDICATIONS:  Outpatient Encounter Medications as of 05/06/2020  Medication Sig  . amLODipine (NORVASC) 5 MG tablet Take 5 mg by mouth daily.  Marland Kitchen apixaban (ELIQUIS) 5 MG TABS tablet Take 1 tablet (5 mg total) by mouth 2 (two) times daily.  Marland Kitchen aspirin 81 MG chewable tablet Chew 81 mg by mouth daily.  Marland Kitchen atorvastatin (LIPITOR) 20 MG tablet Take 20 mg by mouth daily.  . B Complex-C (B-COMPLEX WITH VITAMIN C) tablet Take 1 tablet by mouth daily.  . Calcium-Magnesium-Zinc 334-134-5 MG TABS Take 1 tablet by mouth every evening.  . donepezil (ARICEPT) 10 MG tablet Take 10 mg by mouth at bedtime.  . folic acid (FOLVITE) 1 MG tablet Take 1 mg by mouth daily.  . furosemide (LASIX) 20 MG tablet Take 20 mg by mouth.  . insulin lispro protamine-lispro (HUMALOG 75/25 MIX) (75-25) 100 UNIT/ML SUSP injection Inject 10 Units into the skin daily with breakfast. Inject  11 units once a day at dinner time.  . metFORMIN (GLUCOPHAGE) 1000 MG tablet Take 1,000 mg by mouth 2 (two) times daily with a meal.  . Multiple Vitamins-Minerals (CENTRUM SILVER 50+WOMEN) TABS Take 1 tablet by mouth daily.  Marland Kitchen pyridOXINE (VITAMIN B-6) 50 MG tablet Take 50 mg by mouth daily.  . tamsulosin (FLOMAX) 0.4 MG CAPS capsule Take 1 capsule (0.4 mg total) by mouth daily after supper.  . traZODone (DESYREL) 50 MG tablet Take 50 mg by mouth at bedtime.   No facility-administered encounter medications on file as of 05/06/2020.    Objective: ROS and staff  input  General: NAD EYES: denies vision changes ENMT: denies dysphagia Cardiovascular: denies chest pain Pulmonary: denies  cough, denies increased SOB,  Abdomen: endorses good appetite, denies  constipation, endorses incontinence of bowel GU: denies dysuria, endorses incontinence of urine MSK:  endorses ROM limitations, no falls reported Skin: denies rashes or wounds Neurological: endorses weakness, denies pain,  Psych: Endorses positive mood   Physical Exam: Current and past weights:unavailable Constitutional: , NAD General :frail appearing, WNWD EYES: anicteric sclera,lids intact, no discharge  ENMT: intact hearing,oral mucous membranes moist, dentition intact CV: RRR, no LE edema Pulmonary: no increased work of breathing, no cough, no audible wheezes, room air Abdomen: intake 75-100%, no ascites GU: deferred MSK: mild sacropenia, decreased ROM in all extremities, no contractures of LE, Skin: warm and dry, no rashes or wounds on visible skin Neuro: Weakness, severe cognitive impairment,  Psych: non -anxious affect, A and O x 1 Hem/lymph/immuno/ no widespread bruising   Thank you for the opportunity to participate in the care of Ms.  Toy Cookey.  The palliative care team will continue to follow. Please call our office at [phone] if we can be of additional assistance.  Jason Coop, NP , DNP, MPH, AGPCNP-BC, ACHPN  COVID-19 PATIENT SCREENING TOOL  Person answering questions: ____________staff______ _____   1.  Is the patient or any family member in the home showing any signs or symptoms regarding respiratory infection?               Person with Symptom- __________NA_________________  a. Fever                                                                          Yes___ No___          ___________________  b. Shortness of breath                                                    Yes___ No___          ___________________ c. Cough/congestion                                        Yes___  No___         ___________________ d. Body aches/pains                                                         Yes___ No___        ____________________ e. Gastrointestinal symptoms (diarrhea, nausea)           Yes___ No___        ____________________  2. Within the past 14 days, has anyone living in the home had any contact with someone with or under investigation for COVID-19?    Yes___ No_X_   Person __________________

## 2020-07-03 ENCOUNTER — Encounter: Payer: Medicare PPO | Admitting: Primary Care

## 2020-07-17 ENCOUNTER — Encounter: Payer: Medicare PPO | Admitting: Primary Care

## 2020-08-04 ENCOUNTER — Other Ambulatory Visit: Payer: Self-pay

## 2020-08-04 ENCOUNTER — Non-Acute Institutional Stay: Payer: Medicare PPO | Admitting: Primary Care

## 2020-08-04 DIAGNOSIS — F0280 Dementia in other diseases classified elsewhere without behavioral disturbance: Secondary | ICD-10-CM

## 2020-08-04 DIAGNOSIS — E1169 Type 2 diabetes mellitus with other specified complication: Secondary | ICD-10-CM

## 2020-08-04 DIAGNOSIS — E785 Hyperlipidemia, unspecified: Secondary | ICD-10-CM

## 2020-08-04 DIAGNOSIS — Z515 Encounter for palliative care: Secondary | ICD-10-CM

## 2020-08-04 DIAGNOSIS — W19XXXS Unspecified fall, sequela: Secondary | ICD-10-CM

## 2020-08-04 NOTE — Progress Notes (Signed)
[place] Consult Note Telephone: [phone]  Fax: [phone]    Date of encounter: 08/04/20 PATIENT NAME: Carol Curtis [id number] [phone] (home)  DOB: 11-28-1935 MRN: [id number]  PRIMARY CARE PROVIDER:    [facility],  [place] [address] [phone]  REFERRING PROVIDER:   [facility] [address] [place],   [address] [phone]  RESPONSIBLE PARTY:   Extended Emergency Contact Information Primary Emergency Contact: Marylene Buerger Mobile Phone: [phone] Relation: Other  I met face to face with patient in facility. Palliative Care was asked to follow this patient by consultation request of [facility]* to help address advance care planning and goals of care. This is a follow up  visit.   ASSESSMENT AND RECOMMENDATIONS:   1. Advance Care Planning/Goals of Care: Goals include to maximize quality of life and symptom management. Full code.  2. Symptom Management:   I met with Mrs Wann in her ALF room. She was smiling and appeared in good spirits. She did not exhibit any symptoms of pain, agitation or distress. She was dressed and in her w/c.  She attempted some conversation smiling broadly.   Staff states she is eating well and oob in w/c to begin her day with bath and toileting. She appears to enjoy the process and staff do not report behavior disturbances. Meds reviewed. I spoke with her POA [name], who does not have any current concerns. Continue to follow for palliative assessment and care needs.   3. Follow up Palliative Care Visit: Palliative care will continue to follow for goals of care clarification and symptom management. Return 6 weeks or prn.  4. Family /Caregiver/Community Supports: POA is [name], Lives in ALF [facility].  5. Cognitive / Functional decline: A and O x 1,  dependent in all adls, iadls.   I spent 25 minutes providing this consultation,  from 0955 to 1020. More than 50% of the time in this consultation was spent in counseling and care coordination.  CODE STATUS: FULL  PPS: 40%  HOSPICE ELIGIBILITY/DIAGNOSIS: TBD  Subjective:  CHIEF COMPLAINT: debility  HISTORY OF PRESENT ILLNESS:  Carol Curtis is a 85 y.o. year old female  with debility, dementia,  Insulin dependent diabetes mellitis, who presents for f/u from palliative visit. She was in hospital a month ago with FTT, poor intake, and covid infection the prior week. She is hypo verbal at baseline.   We are asked to consult around advance care planning and complex medical decision making.    Review and summarization of old Epic records shows or history from other than patient. Review or lab tests, radiology,  or medicine HgA1C 12.2, albumin 2.9 Review of case with family member POA [name], staff  History obtained from review of EMR, discussion with primary team, and  interview with family, caregiver  and/or [name]. Records reviewed and summarized above.   CURRENT PROBLEM LIST:  Patient Active Problem List   Diagnosis Date Noted  . Constipation   . Acute deep vein thrombosis (DVT) of popliteal vein of both lower extremities (HCC)   . Acute urinary retention   . Right leg swelling   . Acute metabolic encephalopathy 91/63/8466  . UTI due to extended-spectrum beta lactamase (ESBL) producing Escherichia coli 03/20/2020  . Fall 03/20/2020  . Weakness 03/20/2020  . Type 2 diabetes mellitus with hyperlipidemia ([place]) 03/20/2020  .  Dementia [facility])    PAST MEDICAL HISTORY:  Active Ambulatory Problems    Diagnosis Date Noted  . Dementia ([place])   . Acute metabolic encephalopathy 91/47/8295  . UTI due to extended-spectrum beta lactamase (ESBL) producing Escherichia coli 03/20/2020  . Fall 03/20/2020  . Weakness 03/20/2020  . Type 2 diabetes mellitus with hyperlipidemia ([place])  03/20/2020  . Right leg swelling   . Acute deep vein thrombosis (DVT) of popliteal vein of both lower extremities (HCC)   . Acute urinary retention   . Constipation    Resolved Ambulatory Problems    Diagnosis Date Noted  . No Resolved Ambulatory Problems   Past Medical History:  Diagnosis Date  . Breast cancer ([place])   . Diabetes mellitus without complication ([place])    SOCIAL HX:  Social History   Tobacco Use  . Smoking status: Unknown If Ever Smoked  . Smokeless tobacco: Never Used  Substance Use Topics  . Alcohol use: Never   FAMILY HX: No family history on file.    ALLERGIES: No Known Allergies   PERTINENT MEDICATIONS:  Outpatient Encounter Medications as of 08/04/2020  Medication Sig  . Amino Acids-Protein Hydrolys (PRO-STAT 101) LIQD Take 10 mLs by mouth daily.  Marland Kitchen amLODipine (NORVASC) 5 MG tablet Take 5 mg by mouth daily.  Marland Kitchen apixaban (ELIQUIS) 5 MG TABS tablet Take 1 tablet (5 mg total) by mouth 2 (two) times daily.  Marland Kitchen aspirin 81 MG chewable tablet Chew 81 mg by mouth daily.  Marland Kitchen atorvastatin (LIPITOR) 20 MG tablet Take 20 mg by mouth daily.  . B Complex-C (B-COMPLEX WITH VITAMIN C) tablet Take 1 tablet by mouth daily.  . Calcium-Magnesium-Zinc 334-134-5 MG TABS Take 1 tablet by mouth every evening.  . donepezil (ARICEPT) 10 MG tablet Take 10 mg by mouth at bedtime.  . ferrous sulfate 325 (65 FE) MG tablet Take 325 mg by mouth daily with breakfast.  . folic acid (FOLVITE) 1 MG tablet Take 1 mg by mouth daily.  . furosemide (LASIX) 20 MG tablet Take 20 mg by mouth.  . insulin lispro protamine-lispro (HUMALOG 75/25 MIX) (75-25) 100 UNIT/ML SUSP injection Inject 15 Units into the skin 2 (two) times daily with a meal.  . metFORMIN (GLUCOPHAGE) 1000 MG tablet Take 1,000 mg by mouth 2 (two) times daily with a meal.  . Multiple Vitamins-Minerals (CENTRUM SILVER 50+WOMEN) TABS Take 1 tablet by mouth daily.  Marland Kitchen pyridOXINE (VITAMIN B-6) 50 MG tablet Take 50 mg by mouth daily.  .  traZODone (DESYREL) 50 MG tablet Take 50 mg by mouth at bedtime.  . tamsulosin (FLOMAX) 0.4 MG CAPS capsule Take 1 capsule (0.4 mg total) by mouth daily after supper. (Patient not taking: Reported on 08/04/2020)   No facility-administered encounter medications on file as of 08/04/2020.     Objective: ROS/staff  General: NAD ENMT: denies dysphagia Pulmonary: denies  cough, denies increased SOB Abdomen: endorses fair appetite, denies  constipation, endorses incontinence of bowel GU: denies dysuria, endorses incontinence of urine MSK:  endorses ROM limitations, no falls reported Skin: denies rashes or wounds Neurological: endorses weakness, denies pain, denies insomnia Psych: Endorses positive mood Heme/lymph/immuno: denies bruises, abnormal bleeding  Physical Exam: Current and past weights: 160 lbs in 10/21, would like to update Constitutional:  NAD General: frail appearing, thin EYES: anicteric sclera, lids intact, no discharge  ENMT: intact hearing,oral mucous membranes dry CV: S1S2, RRR, 1+ LE edema Pulmonary: LCTA, no increased work of breathing, no cough, no audible wheezes, room air  Abdomen: intake 50%, normo-active BS +  4 quadrants, soft and non tender, no ascites GU: deferred MSK: moderateesarcopenia, decreased ROM in all extremities, no contractures of LE, non ambulatory, in w/c Skin: warm and dry, no rashes or wounds on visible skin Neuro: Generalized weakness, ++cognitive impairment Psych: non-anxious affect, A and O x 1 Hem/lymph/immuno: no widespread bruising   Thank you for the opportunity to participate in the care of [name].  The palliative care team will continue to follow. Please call our office at [phone] if we can be of additional assistance.  Jason Coop, NP , DNP, MPH, AGPCNP-BC, ACHPN   COVID-19 PATIENT SCREENING TOOL  Person answering questions: _______staff____________   1.  Is the patient or any family member in the home  showing any signs or symptoms regarding respiratory infection?                  Person with Symptom  ______________na___________ a. Fever/chills/headache                                                        Yes___ No__X_            b. Shortness of breath                                                            Yes___ No__X_           c. Cough/congestion                                               Yes___  No__X_          d. Muscle/Body aches/pains                                                   Yes___ No__X_         e. Gastrointestinal symptoms (diarrhea,nausea)             Yes___ No__X_         f. Sudden loss of smell or taste      Yes___ No__X_        2. Within the past 10 days, has anyone living in the home had any contact with someone with or under investigation for COVID-19?    Yes___ No__X__   Person __________________

## 2020-10-08 ENCOUNTER — Other Ambulatory Visit: Payer: Self-pay

## 2020-10-08 ENCOUNTER — Non-Acute Institutional Stay: Payer: Medicare PPO | Admitting: Primary Care

## 2020-10-08 ENCOUNTER — Encounter: Payer: Medicare PPO | Admitting: Primary Care

## 2020-10-08 DIAGNOSIS — F0280 Dementia in other diseases classified elsewhere without behavioral disturbance: Secondary | ICD-10-CM

## 2020-10-08 DIAGNOSIS — E785 Hyperlipidemia, unspecified: Secondary | ICD-10-CM

## 2020-10-08 DIAGNOSIS — Z515 Encounter for palliative care: Secondary | ICD-10-CM

## 2020-10-08 DIAGNOSIS — E1169 Type 2 diabetes mellitus with other specified complication: Secondary | ICD-10-CM

## 2020-10-08 NOTE — Progress Notes (Addendum)
[facility] Consult Note Telephone: [phone]  Fax: [phone]    Date of encounter: 10/08/20 PATIENT NAME: Carol Curtis [id number]   [id number] (home)  DOB: [birth year] MRN: [id number] PRIMARY CARE PROVIDER:    [facility],  [place] Curtis [id number] [phone]  REFERRING PROVIDER:   [facility] [address] Carol Curtis Carol Curtis,  [place] [address] [phone]  RESPONSIBLE PARTY:    Contact Information    Name Relation Home Work [place], [place] Other   [phone]      I met face to face with patient in [facility]   facility. Palliative Care was asked to follow this patient by consultation request of  [facility]* to address advance care planning and complex medical decision making. This is a follow up visit.                                   ASSESSMENT AND PLAN / RECOMMENDATIONS:   Advance Care Planning/Goals of Care: Goals include to maximize quality of life and symptom management. T/c to POA, no answer, message left  CODE STATUS: TBD  Symptom Management/Plan:  I met with patient in her assisted living facility. She is unable to give me any reports or review of systems so this was given per staff. They endorse that she has been doing well and eating her meals. She has not had weight loss per staff report. They also denied any behavior disturbances. She is dependent in all adls and iadls.  She has recently had some medication de escalation and adjustment of insulin to 18 units of 70/30 bid.  Glucose has run 200-300's and A1 C in Jan 22 = 12.2. Continue to monitor per PCP.  All medications reviewed today. Agree with recent d/c of supplements.  Today she is sitting in her wheelchair and enjoying a television program. She's able to smile and make eye contact but it's not able to answer  questions or make any verbal conversation. Staff do not have any requests for medication changes. I reached out to her power of attorney her niece Ms Carol Curtis. No answer but I left a message for her to follow.  Follow up Palliative Care Visit: Palliative care will continue to follow for complex medical decision making, advance care planning, and clarification of goals. Return 6-8 weeks or prn.  I spent 40 minutes providing this consultation. More than 50% of the time in this consultation was spent in counseling and care coordination.  PPS: 30%  HOSPICE ELIGIBILITY/DIAGNOSIS: TBD  Chief Complaint: frailty, severely frail   HISTORY OF PRESENT ILLNESS:  Carol Curtis is a 85 y.o. year old female  with dementia, frailty (severe on frailty scale), DM insulin dependent .   History obtained from review of EMR, discussion with primary team, and interview with family, facility staff/caregiver and/or [name].  I reviewed available labs, medications, imaging, studies and related documents from the EMR.  Records reviewed and summarized above.   ROS/staff  General: NAD ENMT: denies dysphagia Cardiovascular:  chest pain, denies DOE Pulmonary: denies cough, denies increased SOB Abdomen: endorses good appetite, denies constipation, endorses incontinence of bowel GU: denies dysuria, endorses incontinence of urine MSK:  endorses weakness,  no falls reported Skin: denies rashes or wounds Neurological: denies pain, denies insomnia Psych:  Endorses positive mood Heme/lymph/immuno: denies bruises, abnormal bleeding  Physical Exam: Current and past weights: stable per staff report Constitutional: NAD General: frail appearing, thin EYES: anicteric sclera, lids intact, no discharge  ENMT: intact hearing, oral mucous membranes moist CV: RRR, no LE edema Pulmonary: no increased work of breathing, no cough, room air Abdomen: intake 75%, no ascites MSK: moderate to severe sarcopenia, moves all  extremities,  non ambulatory Skin: warm and dry, no rashes or wounds on visible skin Neuro:  ++ generalized weakness,  Severe  cognitive impairment Psych: non-anxious affect, A and O x 1 Hem/lymph/immuno: no widespread bruising  Outpatient Encounter Medications as of 10/08/2020  Medication Sig  . Amino Acids-Protein Hydrolys (PRO-STAT 101) LIQD Take 10 mLs by mouth daily.  Marland Kitchen amLODipine (NORVASC) 5 MG tablet Take 5 mg by mouth daily.  Marland Kitchen apixaban (ELIQUIS) 5 MG TABS tablet Take 1 tablet (5 mg total) by mouth 2 (two) times daily.  Marland Kitchen aspirin 81 MG chewable tablet Chew 81 mg by mouth daily.  . B Complex-C (B-COMPLEX WITH VITAMIN C) tablet Take 1 tablet by mouth daily.  . Calcium 500-100 MG-UNIT CHEW Chew 1 tablet by mouth daily.  Marland Kitchen donepezil (ARICEPT) 10 MG tablet Take 10 mg by mouth at bedtime.  . furosemide (LASIX) 20 MG tablet Take 20 mg by mouth every other day.  . metFORMIN (GLUCOPHAGE) 1000 MG tablet Take 1,000 mg by mouth 2 (two) times daily with a meal.  . Multiple Vitamins-Minerals (CENTRUM SILVER 50+WOMEN) TABS Take 1 tablet by mouth daily.  . tamsulosin (FLOMAX) 0.4 MG CAPS capsule Take 1 capsule (0.4 mg total) by mouth daily after supper.  . traZODone (DESYREL) 50 MG tablet Take 50 mg by mouth at bedtime.  Marland Kitchen NOVOLOG MIX 70/30 FLEXPEN (70-30) 100 UNIT/ML FlexPen Inject 18 Units into the skin in the morning and at bedtime.  . [DISCONTINUED] atorvastatin (LIPITOR) 20 MG tablet Take 20 mg by mouth daily.  . [DISCONTINUED] Calcium-Magnesium-Zinc 334-134-5 MG TABS Take 1 tablet by mouth every evening. (Patient not taking: Reported on 10/08/2020)  . [DISCONTINUED] ferrous sulfate 325 (65 FE) MG tablet Take 325 mg by mouth daily with breakfast. (Patient not taking: Reported on 10/08/2020)  . [DISCONTINUED] folic acid (FOLVITE) 1 MG tablet Take 1 mg by mouth daily. (Patient not taking: Reported on 10/08/2020)  . [DISCONTINUED] insulin lispro protamine-lispro (HUMALOG 75/25 MIX) (75-25) 100 UNIT/ML  SUSP injection Inject 15 Units into the skin 2 (two) times daily with a meal.  . [DISCONTINUED] pyridOXINE (VITAMIN B-6) 50 MG tablet Take 50 mg by mouth daily.   No facility-administered encounter medications on file as of 10/08/2020.      Thank you for the opportunity to participate in the care of [name].  The palliative care team will continue to follow. Please call our office at [phone] if we can be of additional assistance.   Jason Coop, NP , DNP, MPH, AGPCNP-BC, ACHPN  COVID-19 PATIENT SCREENING TOOL Asked and negative response unless otherwise noted:   Have you had symptoms of covid, tested positive or been in contact with someone with symptoms/positive test in the past 5-10 days?

## 2020-11-24 ENCOUNTER — Other Ambulatory Visit: Payer: Self-pay

## 2020-11-24 ENCOUNTER — Non-Acute Institutional Stay: Payer: Medicare PPO | Admitting: Primary Care

## 2020-11-24 DIAGNOSIS — W19XXXS Unspecified fall, sequela: Secondary | ICD-10-CM

## 2020-11-24 DIAGNOSIS — Z515 Encounter for palliative care: Secondary | ICD-10-CM

## 2020-11-24 DIAGNOSIS — F0280 Dementia in other diseases classified elsewhere without behavioral disturbance: Secondary | ICD-10-CM

## 2020-11-24 NOTE — Progress Notes (Signed)
[facility] Consult Note Telephone: [phone]  Fax: [phone]    Date of encounter: 11/24/20 PATIENT NAME: [place] [id number]   [phone] (home)  DOB: [birth year] MRN: [id number] PRIMARY CARE PROVIDER:    [facility],  [place] [address] [phone]  REFERRING PROVIDER:   [facility], [facility] [address] [place],  [place] [address] [phone]  RESPONSIBLE PARTY:    Contact Information    Name Relation Home Work [place], [place] Other   [phone]       I met face to face with patient in [place] facility. Palliative Care was asked to follow this patient by consultation request of  [facility] to address advance care planning and complex medical decision making. This is a follow up visit.                                   ASSESSMENT AND PLAN / RECOMMENDATIONS:   Advance Care Planning/Goals of Care: Goals include to maximize quality of life and symptom management.  Symptom Management/Plan: I met with patient today in her assisted living facility. Staff reports she is doing well no behaviors that are problematic or uncomfortable for her. Her weight is maintained at 166 pounds. On our interview she is not able to speak but can smile and nod as she attempts interaction. She is however not verbal with me today. Staff deny  any issues with pain or insomnia.   Her medication list was reviewed today as well. I called her power of attorney niece [name]. She was not able to answer and I have again left a message for any need to follow up.   Follow up Palliative Care Visit: Palliative care will continue to follow for complex medical decision making, advance care planning, and clarification of goals. Return 8 weeks or prn.  I spent 25 minutes providing this consultation. More than 50% of the time in  this consultation was spent in counseling and care coordination.  PPS: 30%  HOSPICE ELIGIBILITY/DIAGNOSIS: no  Chief Complaint: dementia  HISTORY OF PRESENT ILLNESS:  EMIKA TIANO is a 85 y.o. year old female  with dementia, DM.  History obtained from review of EMR, discussion with primary team, and interview with family, facility staff/caregiver and/or [name].    ROS/staff General: NAD ENMT: denies dysphagia Pulmonary: denies cough, denies increased SOB Abdomen: endorses good appetite, denies constipation, endorses incontinence of bowel GU: denies dysuria, endorses incontinence of urine MSK:  Endorses  weakness,  no falls reported Skin: denies rashes or wounds Neurological: denies pain, denies insomnia Psych: Endorses positive mood Heme/lymph/immuno: denies bruises, abnormal bleeding  Physical Exam: Current and past weights: 166 lbs, 6 lb gain from 6-8 mos. Constitutional: NAD General: frail appearing, WNWD EYES: anicteric sclera, lids intact, no discharge  ENMT: intact hearing, oral mucous membranes moist, dentition intact, bruxism CV: no LE edema Pulmonary:  no increased work of breathing, no cough, room air Abdomen: intake 100%,soft and non tender, no ascites GU: deferred MSK: mild  sarcopenia, moves all extremities, non ambulatory Skin: warm and dry, no rashes or wounds on visible skin Neuro:  + generalized weakness,  Severe cognitive impairment Psych: non-anxious affect, A and O x 1 Hem/lymph/immuno: no widespread bruising  Outpatient Encounter Medications as of 11/24/2020  Medication Sig  .  Amino Acids-Protein Hydrolys (PRO-STAT 101) LIQD Take 10 mLs by mouth daily.  Marland Kitchen amLODipine (NORVASC) 5 MG tablet Take 5 mg by mouth daily.  Marland Kitchen apixaban (ELIQUIS) 5 MG TABS tablet Take 1 tablet (5 mg total) by mouth 2 (two) times daily.  Marland Kitchen aspirin 81 MG chewable tablet Chew 81 mg by mouth daily.  . B Complex-C (B-COMPLEX WITH VITAMIN C) tablet Take 1 tablet by mouth daily.   Marland Kitchen donepezil (ARICEPT) 10 MG tablet Take 10 mg by mouth at bedtime.  . furosemide (LASIX) 20 MG tablet Take 20 mg by mouth every other day.  . metFORMIN (GLUCOPHAGE) 1000 MG tablet Take 1,000 mg by mouth 2 (two) times daily with a meal.  . Multiple Vitamins-Minerals (CENTRUM SILVER 50+WOMEN) TABS Take 1 tablet by mouth daily.  Marland Kitchen NOVOLOG MIX 70/30 FLEXPEN (70-30) 100 UNIT/ML FlexPen Inject 26 Units into the skin in the morning and at bedtime.  . tamsulosin (FLOMAX) 0.4 MG CAPS capsule Take 1 capsule (0.4 mg total) by mouth daily after supper.  . traZODone (DESYREL) 50 MG tablet Take 50 mg by mouth at bedtime.  . Calcium 500-100 MG-UNIT CHEW Chew 1 tablet by mouth daily.   No facility-administered encounter medications on file as of 11/24/2020.     Thank you for the opportunity to participate in the care of [name].  The palliative care team will continue to follow. Please call our office at [phone] if we can be of additional assistance.   Jason Coop, NP , DNP, MPH, AGPCNP-BC, ACHPN  COVID-19 PATIENT SCREENING TOOL Asked and negative response unless otherwise noted:   Have you had symptoms of covid, tested positive or been in contact with someone with symptoms/positive test in the past 5-10 days?

## 2021-02-09 IMAGING — CT CT HEAD W/O CM
3 of 4 series · 16 of 47 positions shown, 19 images · non-contrast
Comparison: None.

CLINICAL DATA: Mental status changes

EXAM:
CT HEAD WITHOUT CONTRAST
TECHNIQUE: Contiguous axial images were obtained from the base of the skull
through the vertex without intravenous contrast.

[Series 3: head wo · axial · 0.40mm/px · z∈[+142,+262]mm · 10 of 29 slices shown, 13 images]
[im 3/29  brain]
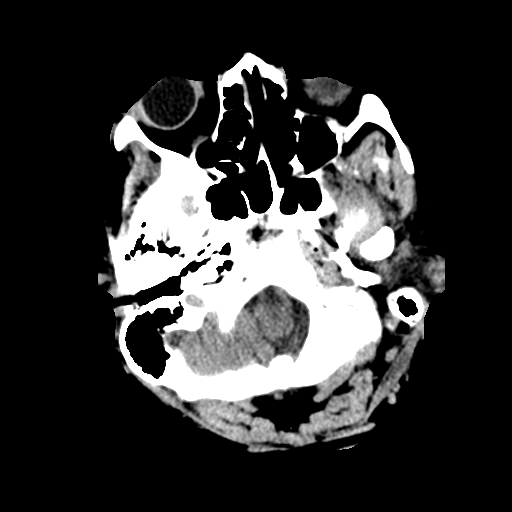
[im 3/29  bone]
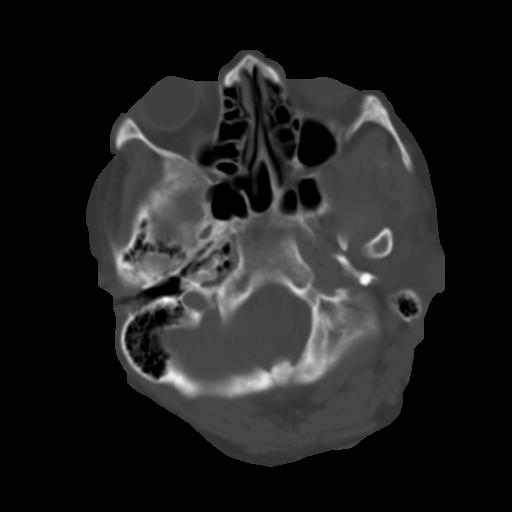
[im 5/29  brain]
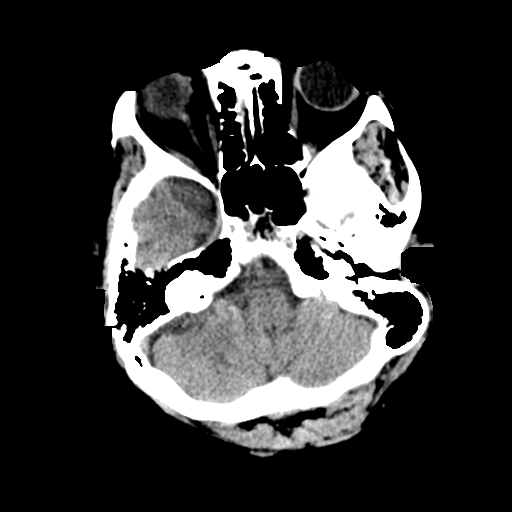
[im 9/29  brain]
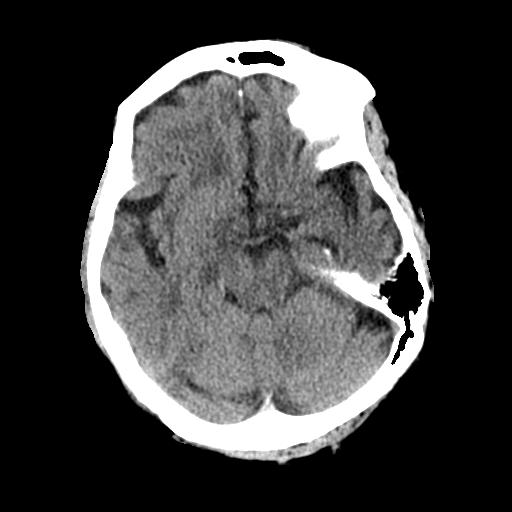
[im 11/29  brain]
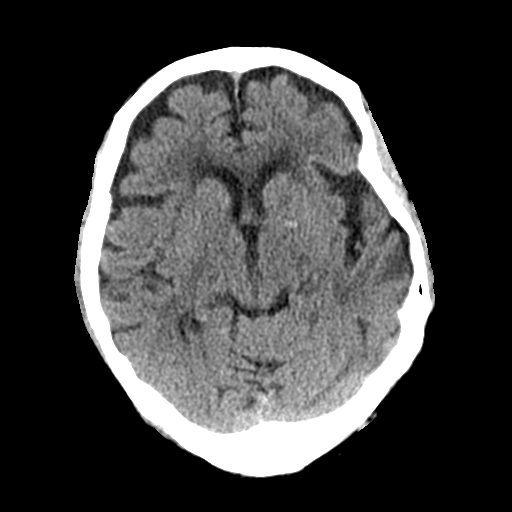
[im 13/29  brain]
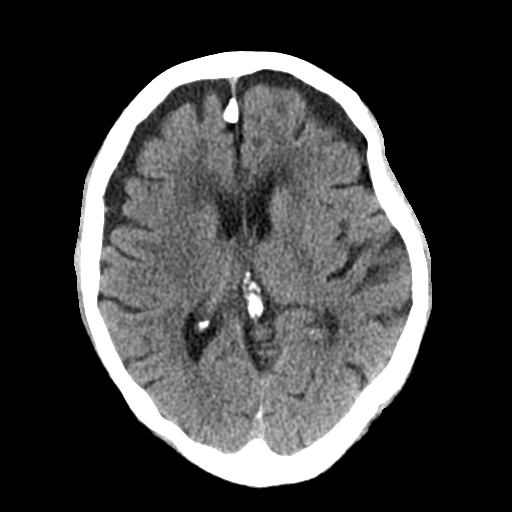
[im 13/29  bone]
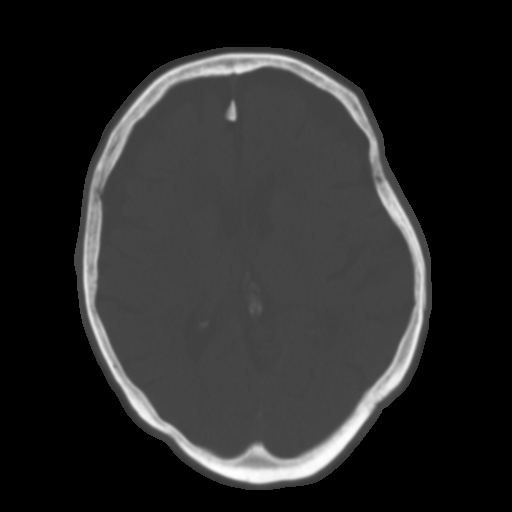
[im 17/29  brain]
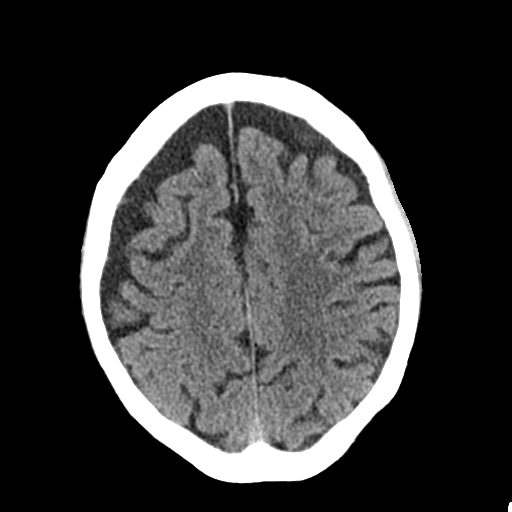
[im 19/29  brain]
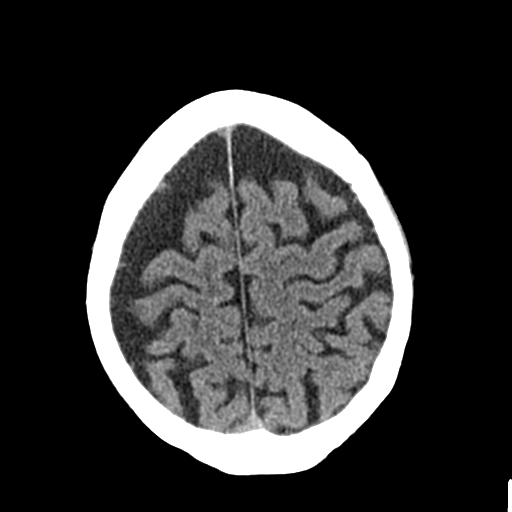
[im 21/29  brain]
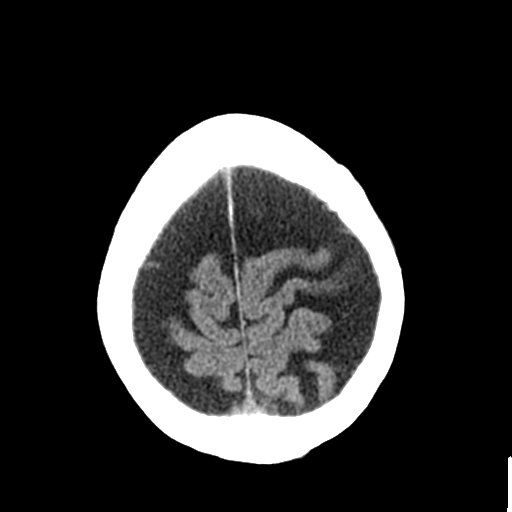
[im 25/29  brain]
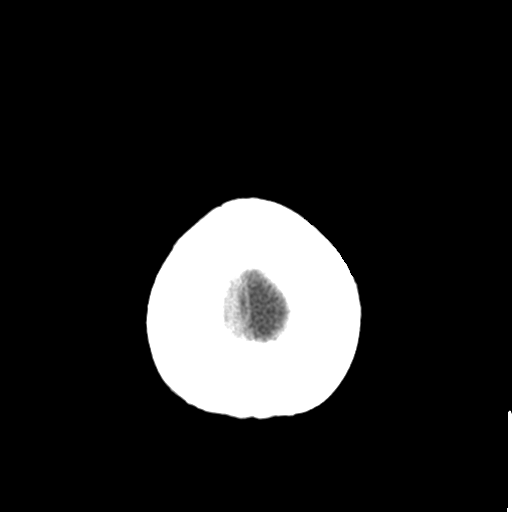
[im 25/29  bone]
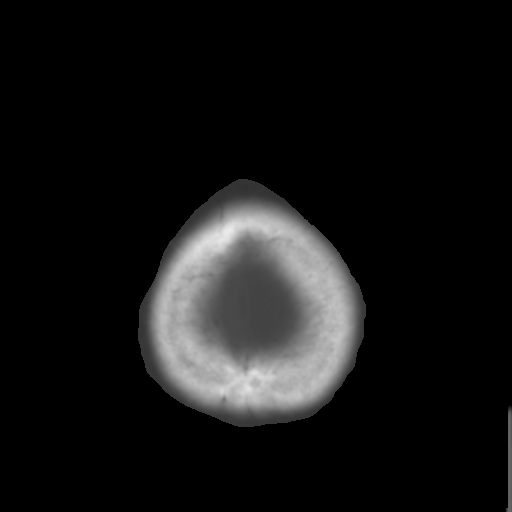
[im 27/29  brain]
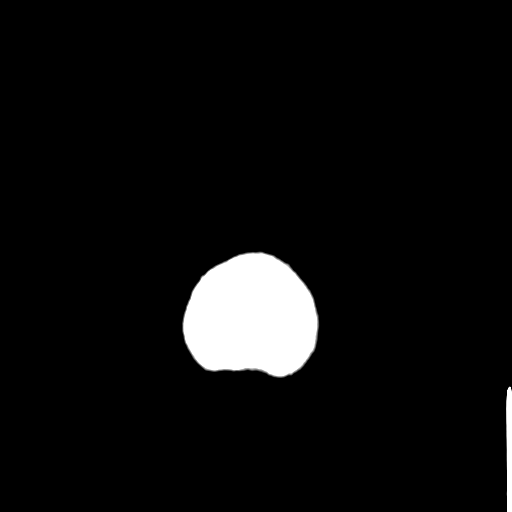

[Series 4: coronal soft tissue · coronal · 0.28mm/px · 3 of 59 slices shown]
[im 20/59  brain]
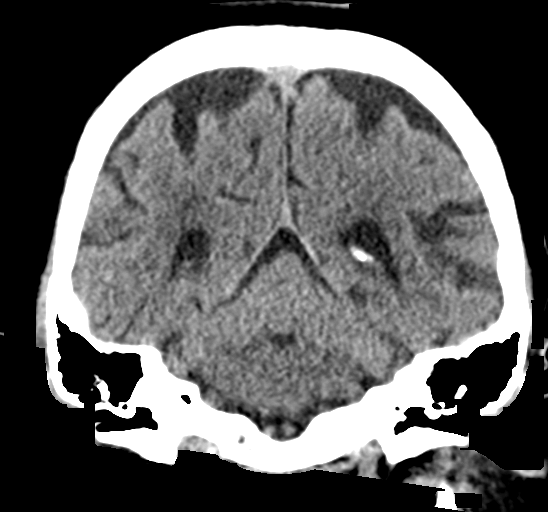
[im 26/59  brain]
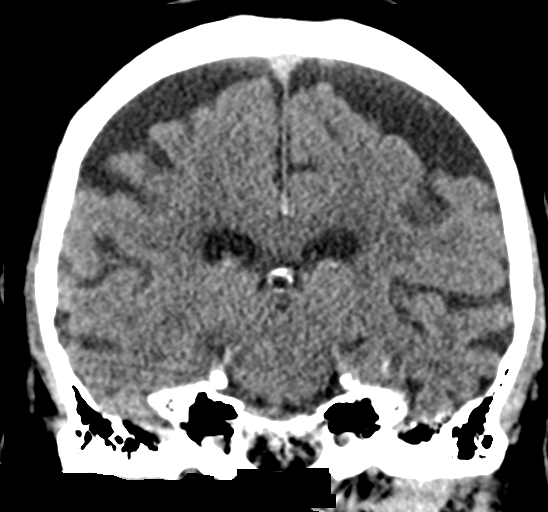
[im 33/59  brain]
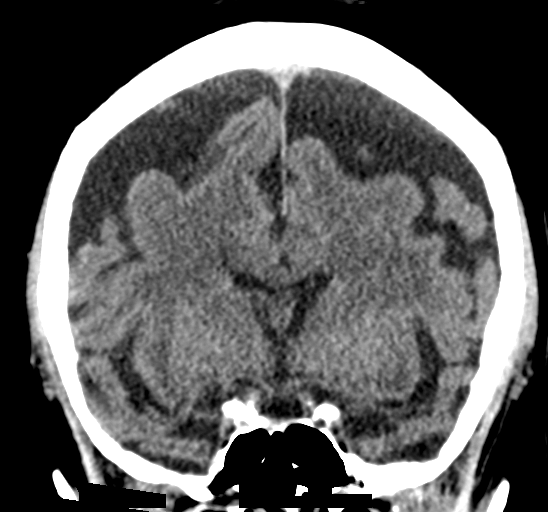

[Series 5: sagittal soft tissue · sagittal · 0.28mm/px · 3 of 52 slices shown]
[im 19/52  brain]
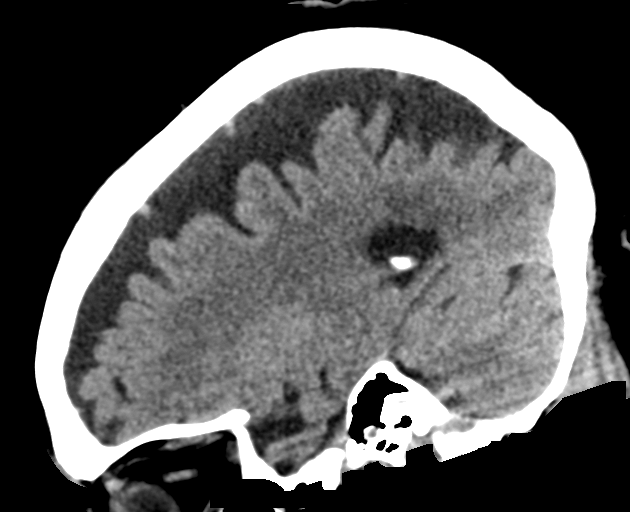
[im 26/52  brain]
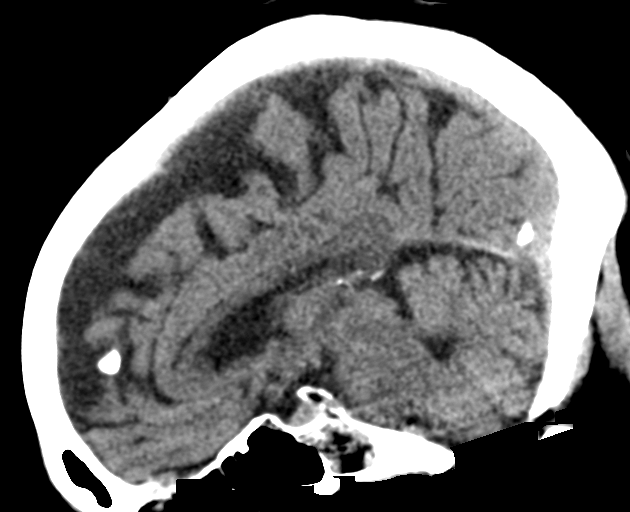
[im 33/52  brain]
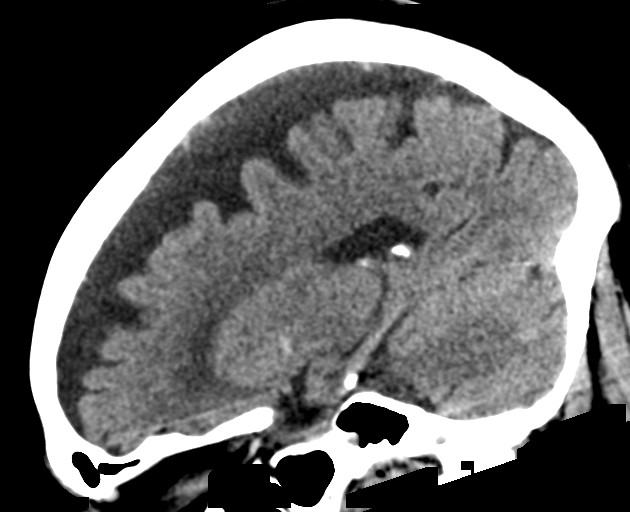

[16 of 47 positions shown; findings below may reference images not displayed]

FINDINGS: Brain: There is atrophy and chronic small vessel disease changes. No
acute intracranial abnormality. Specifically, no hemorrhage,
hydrocephalus, mass lesion, acute infarction, or significant
intracranial injury.

Vascular: No hyperdense vessel or unexpected calcification.

Skull: No acute calvarial abnormality.

Sinuses/Orbits: Visualized paranasal sinuses and mastoids clear.
Orbital soft tissues unremarkable.

Other: None
IMPRESSION: Atrophy, chronic microvascular disease.

No acute intracranial abnormality.

## 2021-04-03 IMAGING — CR DG CHEST 1V PORT
1 series · 1 of 1 positions shown · non-contrast
Comparison: None.

CLINICAL DATA: Un witnessed fall with chest pain, initial encounter

EXAM:
PORTABLE CHEST 1 VIEW

[dg chest port 1 view]
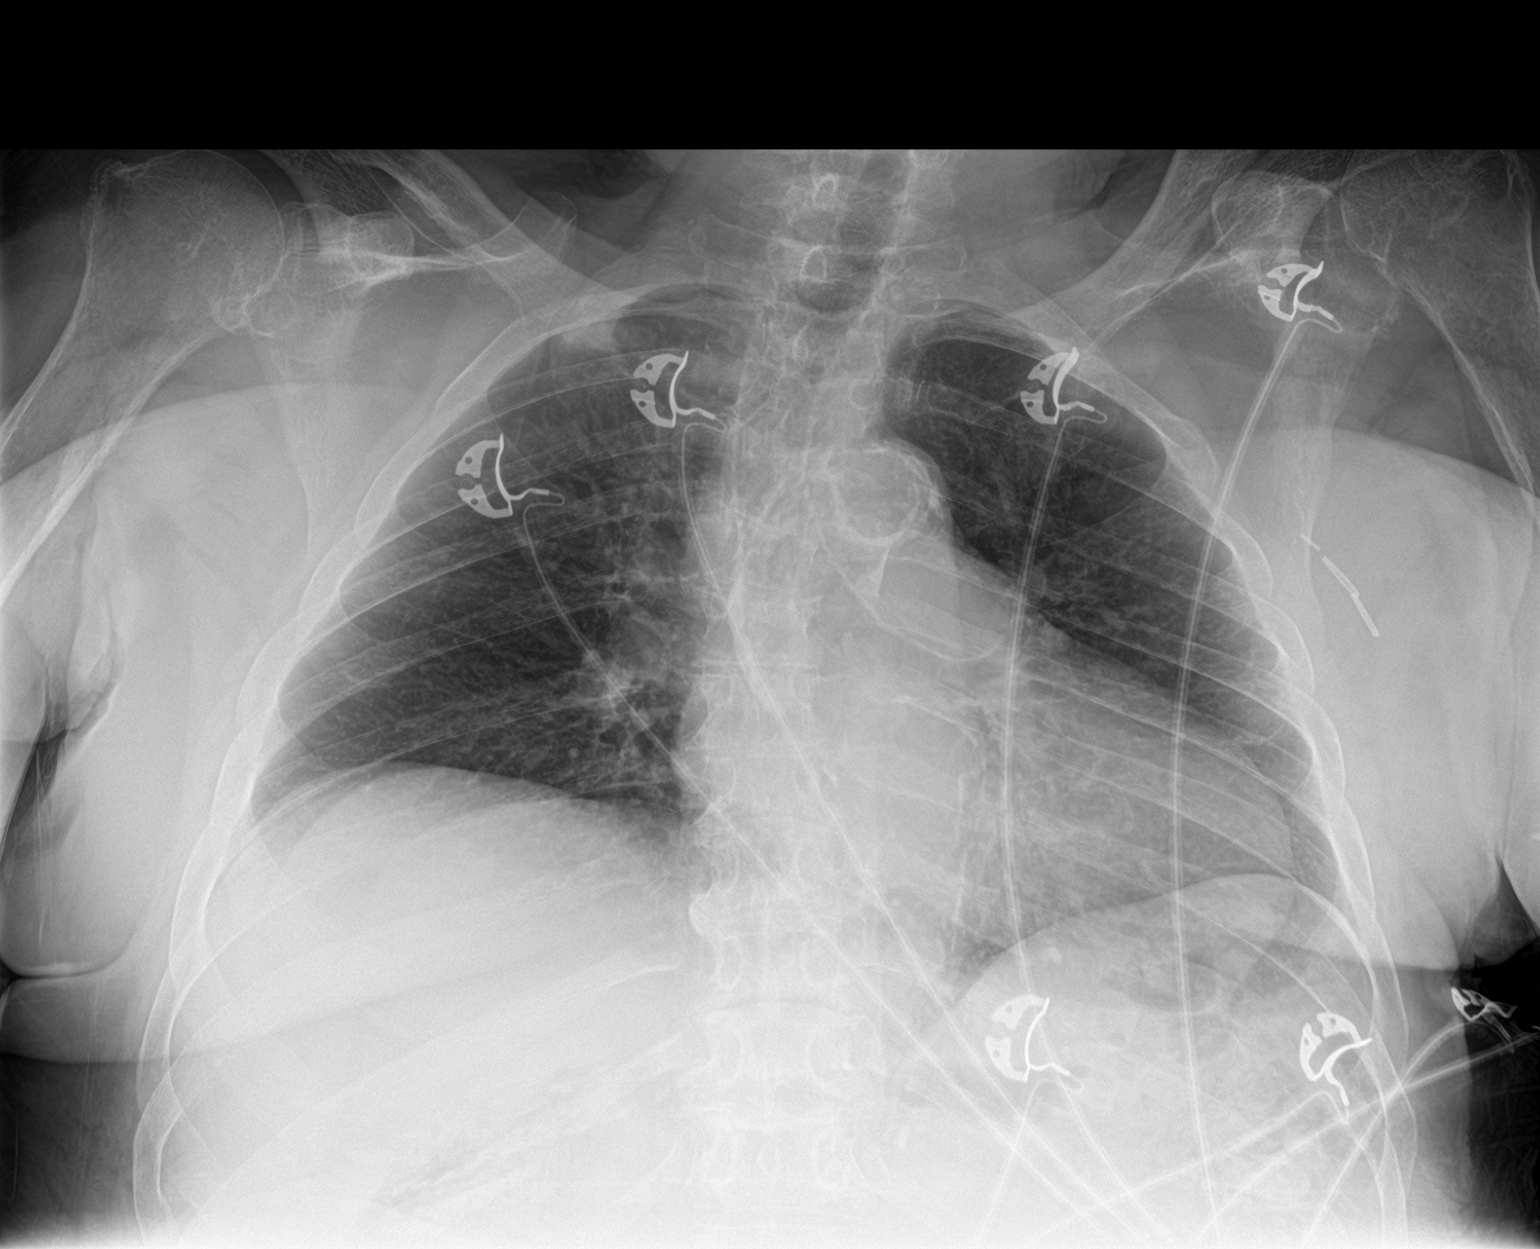

[1 of 1 positions shown; findings below may reference images not displayed]

FINDINGS: Cardiac shadow is within normal limits. Aortic calcifications are
seen. The lungs are clear. Postsurgical changes in the left axilla
are noted. No acute bony abnormality is noted.
IMPRESSION: No active disease.

## 2021-04-22 ENCOUNTER — Other Ambulatory Visit: Payer: Self-pay

## 2021-04-22 ENCOUNTER — Non-Acute Institutional Stay: Payer: Medicare PPO | Admitting: Primary Care

## 2021-04-22 DIAGNOSIS — E1169 Type 2 diabetes mellitus with other specified complication: Secondary | ICD-10-CM

## 2021-04-22 DIAGNOSIS — F0280 Dementia in other diseases classified elsewhere without behavioral disturbance: Secondary | ICD-10-CM

## 2021-04-22 DIAGNOSIS — Z515 Encounter for palliative care: Secondary | ICD-10-CM

## 2021-04-22 NOTE — Progress Notes (Signed)
Designer, [facility]ell[facility] Palliative Care Consult Note Telephone: [phone]  Fax: [phone]    Date of encounter: 04/22/21 9:57 AM PATIENT NAME: Carol Curtis [address] [place] [address]   [phone] (home) [phone] (work) DOB: 04-10-1936 MRN: [id number] PRIMARY CARE PROVIDER:    [facility],  [place] [address] [phone]  REFERRING PROVIDER:   [facility] [place] [place] [address] [place],  [place] [address] [phone]  RESPONSIBLE PARTY:    Contact Information     Name Relation Home Work [place], [place] Other   [phone]       I met face to face with patient in [place] [facility]. Palliative Care was asked to follow this patient by consultation request of  [facility] Curtis* to address advance care planning and complex medical decision making. This is a follow up visit.                                   ASSESSMENT AND PLAN / RECOMMENDATIONS:   Advance Care Planning/Goals of Care: Goals include to maximize quality of life and symptom management.  CODE STATUS: full code T/C to PR, not able to reach, message left, text sent.  Symptom Management/Plan:  Patient up in w/c in common area. Alert but dozing on and off. Staff endorse good appetite, unable to ambulate. Patient able to smile, speak a few words. FAST score 7A-B. Weight is stable, recent albumin WNL. I have reached out to PR several times to discuss ACP but not able to reach at available number. Verified this is number the ALF has on file as well.   Follow up Palliative Care Visit: Palliative care will continue to follow for complex medical decision making, advance care planning, and clarification of goals. Return 6-8 weeks or prn.  I spent 25 minutes providing this consultation. More than 50% of the time in this consultation was spent in counseling and care  coordination.  PPS: 30%  HOSPICE ELIGIBILITY/DIAGNOSIS: no  Chief Complaint: dementia  HISTORY OF PRESENT ILLNESS:  Carol Curtis is a 85 y.o. year old female  with dementia, immobility, DM .   History obtained from review of EMR, discussion with primary team, and interview with family, facility staff/caregiver and/or [name].  I reviewed available labs, medications, imaging, studies and related documents from the EMR.  Records reviewed and summarized above.   ROS/staff  General: NAD ENMT: denies dysphagia Pulmonary: denies cough, denies increased SOB Abdomen: endorses good appetite, denies constipation, endorses incontinence of bowel GU: denies dysuria, endorses incontinence of urine MSK:  endorses  weakness,  no falls reported Skin: denies rashes or wounds Neurological: denies pain, denies insomnia Psych: Endorses positive mood Heme/lymph/immuno: denies bruises, abnormal bleeding  Physical Exam: Current and past weights: 174.4 lbs Constitutional: NAD General: frail appearing, WNWD EYES: anicteric sclera, lids intact, no discharge  ENMT: intact hearing, oral mucous membranes moist, dentition overall intact, 1 tooth missing CV: slight bil  LE edema Pulmonary: no increased work of breathing, no cough, room air Abdomen: intake 100%,  no ascites, BG 180 mg/ dl, albumin 3.8  GU: deferred MSK: + sarcopenia, moves all extremities, non -ambulatory Skin: warm and dry, no rashes or wounds on visible skin Neuro:  ++ generalized weakness,  severe cognitive impairment Psych: non-anxious affect, A and O x 1 Hem/lymph/immuno: no widespread  bruising   Thank you for the opportunity to participate in the care of [name].  The palliative care team will continue to follow. Please call our office at [phone] if we can be of additional assistance.   Jason Coop, NP DNP, AGPCNP-BC  COVID-19 PATIENT SCREENING TOOL Asked and negative response unless otherwise noted:    Have you had symptoms of covid, tested positive or been in contact with someone with symptoms/positive test in the past 5-10 days?

## 2021-06-08 ENCOUNTER — Non-Acute Institutional Stay: Payer: Medicare PPO | Admitting: Primary Care

## 2021-06-08 ENCOUNTER — Other Ambulatory Visit: Payer: Self-pay

## 2021-06-08 DIAGNOSIS — Z515 Encounter for palliative care: Secondary | ICD-10-CM

## 2021-06-08 DIAGNOSIS — M7989 Other specified soft tissue disorders: Secondary | ICD-10-CM

## 2021-06-08 DIAGNOSIS — L853 Xerosis cutis: Secondary | ICD-10-CM | POA: Insufficient documentation

## 2021-06-08 DIAGNOSIS — F0280 Dementia in other diseases classified elsewhere without behavioral disturbance: Secondary | ICD-10-CM

## 2021-06-08 NOTE — Progress Notes (Signed)
Designer, [facility] Palliative Care Consult Note Telephone: [phone]  Fax: [phone]    Date of encounter: 06/08/21 9:08 PM PATIENT NAME: Carol Curtis   [phone] (home) [phone] (work) DOB: [birth year] MRN: [id number] PRIMARY CARE PROVIDER:    [facility],  [place] [address] [phone]  REFERRING PROVIDER:   Marinus Maw, PA [facility] [place] [address] [place],  [place] [address] [phone]  RESPONSIBLE PARTY:    Contact Information     Name Relation Home Work [place], [place] Other   [phone]        I met face to face with patient in [place] [facility] [place]  facility. Palliative Care was asked to follow this patient by consultation request of  [facility] [place]* to address advance care planning and complex medical decision making. This is a follow up visit.                                   ASSESSMENT AND PLAN / RECOMMENDATIONS:   Advance Care Planning/Goals of Care: Goals include to maximize quality of life and symptom management.  Symptom Management/Plan:  I met with patient in her assisted-living home. She is nonverbal but able to make eye contact, smile and  respond to touch. Staff states she is doing well, eating normally. Weights have not been available due to renovations in the facility and no access to scales. Subjectively she looks well nourished and well developed. Her lower extremities are edematous with 2 to 3+ edema and very dry skin. I sent an order for Minerin creme to her legs  and feet each shift for dry skin. I have not been able to locate a family member in my last few visits and will continue trying to reach out.   Follow up Palliative Care Visit: Palliative care will continue to follow for complex medical decision making, advance care planning, and clarification of goals.  Return 6-8 weeks or prn.  I spent 15 minutes providing this consultation. More than 50% of the time in this consultation was spent in counseling and care coordination.  PPS: 40%  HOSPICE ELIGIBILITY/DIAGNOSIS: no  Chief Complaint: dry skin dermatitis, dementia  HISTORY OF PRESENT ILLNESS:  Carol Curtis is a 85 y.o. year old female  with dementia, immobility, dry skin dermatitis of bil LE.   History obtained from review of EMR, discussion with primary team, and interview with family, facility staff/caregiver and/or [name].  I reviewed available labs, medications, imaging, studies and related documents from the EMR.  Records reviewed and summarized above.   ROS/staff  General: NAD ENMT: denies dysphagia Pulmonary: denies cough, denies increased SOB Abdomen: endorses good appetite, denies constipation, endorses incontinence of bowel GU: denies odor change, endorses incontinence of urine MSK:  endorses  weakness,  no falls reported Skin: endorses dry skin  Neurological: denies pain, denies insomnia Psych: Endorses positive mood Heme/lymph/immuno: denies bruises, abnormal bleeding  Physical Exam: Current and past weights:unavailable Constitutional: NAD General: frail appearing EYES: anicteric sclera, lids intact, no discharge  ENMT: intact hearing, oral mucous membranes moist CV: 2-3 + bil LE edema Pulmonary:  no increased work of breathing, no cough, room air Abdomen: intake 75%,  no ascites GU: deferred MSK: mild to mod sarcopenia, moves all extremities,  non ambulatory Skin: very dry skin  of LE, hands Neuro:  +generalized weakness,  severe  cognitive impairment Psych: non-anxious affect, A and O x 1 Hem/lymph/immuno: no widespread bruising   Thank you for the opportunity to participate in the care of [name].  The palliative care team will continue to follow. Please call our office at [phone] if we can be of additional assistance.   Jason Coop,  NP DNP, AGPCNP-BC  COVID-19 PATIENT SCREENING TOOL Asked and negative response unless otherwise noted:   Have you had symptoms of covid, tested positive or been in contact with someone with symptoms/positive test in the past 5-10 days?

## 2021-07-08 ENCOUNTER — Other Ambulatory Visit: Payer: Self-pay

## 2021-07-08 ENCOUNTER — Other Ambulatory Visit: Payer: Medicare PPO | Admitting: Primary Care

## 2021-07-08 DIAGNOSIS — F0280 Dementia in other diseases classified elsewhere without behavioral disturbance: Secondary | ICD-10-CM

## 2021-07-08 DIAGNOSIS — E785 Hyperlipidemia, unspecified: Secondary | ICD-10-CM

## 2021-07-08 DIAGNOSIS — L853 Xerosis cutis: Secondary | ICD-10-CM

## 2021-07-08 DIAGNOSIS — E1169 Type 2 diabetes mellitus with other specified complication: Secondary | ICD-10-CM

## 2021-07-08 NOTE — Progress Notes (Signed)
[facility]er, [facility] Palliative Care Consult Note Telephone: [phone]  Fax: [phone]    Date of encounter: 07/08/21 10:47 AM PATIENT NAME: Clinton [address] [place] [address]   [phone] (home) [phone] (work) DOB: 04-12-1936 MRN: [id number] PRIMARY CARE PROVIDER:    [facility],  [place] [address] [phone]  REFERRING PROVIDER:   [facility] [place],  [place] [address] [phone]  RESPONSIBLE PARTY:    Contact Information     Name Relation Home Work [place], [place] Other   [phone]       I met face to face with patient in [place] [facility] [place] facility. Palliative Care was asked to follow this patient by consultation request of  [facility]* to address advance care planning and complex medical decision making. This is a follow up visit.                                   ASSESSMENT AND PLAN / RECOMMENDATIONS:   Advance Care Planning/Goals of Care: Goals include to maximize quality of life and symptom management.  Have not been able to reach Proxy by phone, email sent.  Symptom Management/Plan: Face to Face for DME assessment.  Mobility: No falls reported, needs transport chair with foot rests. Currently using facility's w/c without brakes. Patient no longer can ambulate and needs to be transported in transport chair. Able and willing caregivers are available.  She is also decreasing in her ability to transfer.  Nutrition: Eating well, has gained 6 lbs since July 22. Appears alert, WNWD.  Skin on LE: Much improved in dryness with current use of moisturizer. 2+ edema bil to mid calf.  Anemia: Labs reviewed, iron levels low. PCP to advise.  Follow up Palliative Care Visit: Palliative care will continue to follow for complex medical decision making, advance care planning, and clarification  of goals. Return 6-8 weeks or prn.  This visit was coded based on medical decision making (MDM).  PPS: 30%  HOSPICE ELIGIBILITY/DIAGNOSIS: TBD  Chief Complaint: dementia  HISTORY OF PRESENT ILLNESS:  HAZELEE HARBOLD is a 86 y.o. year old female  with dementia, fall risk.   History obtained from review of EMR, discussion with primary team, and interview with family, facility staff/caregiver and/or [name].  I reviewed available labs, medications, imaging, studies and related documents from the EMR.  Records reviewed and summarized above.   ROS/staff  General: NAD ENMT: denies dysphagia Cardiovascular: denies DOE Pulmonary: denies cough, denies increased SOB Abdomen: endorses good appetite, denies constipation, endorses incontinence of bowel GU: denies dysuria, endorses incontinence of urine MSK:  endorses   increased weakness,  no falls reported Skin: denies rashes or wounds Neurological: denies pain, denies insomnia Psych: Endorses positive mood, non verbal Heme/lymph/immuno: denies bruises, abnormal bleeding  Physical Exam: Current and past weights: 172 lbs.gain from 166 lbs in 6/22. Constitutional: NAD General: frail appearing, WNWD EYES: anicteric sclera, lids intact, no discharge  ENMT: intact hearing, oral mucous membranes moist, dentition intact CV: S1S2, RRR, no LE edema Pulmonary: LCTA, no increased work of breathing, no cough, room air Abdomen: intake 100%, normo-active BS + 4 quadrants, soft and non tender, no ascites GU: deferred MSK: + sarcopenia, moves all extremities,  non ambulatory, fall risk Skin: warm and dry, no rashes or wounds on visible  skin Neuro:  no generalized weakness,  severe cognitive impairment Psych: non-anxious affect, A and O x 1 Hem/lymph/immuno: no widespread bruising   Thank you for the opportunity to participate in the care of [name].  The palliative care team will continue to follow. Please call our office at [phone] if  we can be of additional assistance.   Jason Coop, NP DNP, AGPCNP-BC  COVID-19 PATIENT SCREENING TOOL Asked and negative response unless otherwise noted:   Have you had symptoms of covid, tested positive or been in contact with someone with symptoms/positive test in the past 5-10 days?

## 2021-07-23 ENCOUNTER — Telehealth: Payer: Self-pay

## 2021-07-23 NOTE — Telephone Encounter (Signed)
349 pm.  Patient has not received her transport chair.  Phone call made to [employer] to follow up on chair.  Per [employer], delivery occurred 2-3 x but no one answers the door or phone when called.  Confirmed facility address.  [facility] has 2 addresses listed with one being [facility] [place].  [employer] will contact the delivery driver to obtain more information and ensure they have correct address for patient.  Call back number provided if additional assistance is needed.  Ralene Bathe, NP updated.

## 2021-07-24 ENCOUNTER — Telehealth: Payer: Self-pay

## 2021-07-24 NOTE — Telephone Encounter (Signed)
504 pm.  Phone call made to [employer] to follow up on transport chair.  Spoke with Lurline Idol who advised transport chair was delivered to the facility yesterday at 445 pm.  Ralene Bathe, NP notified.

## 2021-07-30 ENCOUNTER — Telehealth: Payer: Self-pay

## 2021-07-30 NOTE — Telephone Encounter (Signed)
350 pm.  Request received from Ralene Bathe, NP to exchange transport chair for a standard wheelchair with removable foot rest.  Phone call made to [employer] and spoke with [place].  New order will need to be sent and chairs can be exchanged.  New order for standard wheelchair faxed to [employer].

## 2021-08-04 ENCOUNTER — Other Ambulatory Visit: Payer: Medicare PPO | Admitting: Primary Care

## 2021-08-04 ENCOUNTER — Other Ambulatory Visit: Payer: Self-pay

## 2021-08-04 VITALS — Ht 64.0 in | Wt 182.0 lb

## 2021-08-04 DIAGNOSIS — M7989 Other specified soft tissue disorders: Secondary | ICD-10-CM

## 2021-08-04 DIAGNOSIS — F0280 Dementia in other diseases classified elsewhere without behavioral disturbance: Secondary | ICD-10-CM

## 2021-08-04 DIAGNOSIS — W19XXXS Unspecified fall, sequela: Secondary | ICD-10-CM

## 2021-08-04 DIAGNOSIS — Z515 Encounter for palliative care: Secondary | ICD-10-CM

## 2021-08-04 DIAGNOSIS — R531 Weakness: Secondary | ICD-10-CM

## 2021-08-04 NOTE — Progress Notes (Signed)
[facility]er, [facility] Palliative Care Consult Note Telephone: [phone]  Fax: [phone]     Due to the COVID-19 crisis, this visit was done via telemedicine from my office and it was initiated and consent by this patient and or family.  I connected with  Carol Curtis OR PROXY on 08/04/21 by a video enabled telemedicine application and verified that I am speaking with the correct person using two identifiers.   I discussed the limitations of evaluation and management by telemedicine. The patient expressed understanding and agreed to proceed.  Date of encounter: 08/04/21 12:03 PM PATIENT NAME: Carol Curtis[place] [address]   [phone] (home) [phone] (work) DOB: [birth year] MRN: [id number] PRIMARY CARE PROVIDER:    [facility],  [place] [address] [phone]  REFERRING PROVIDER:   [facility], [facility] [place] [address],  [place] [address] [phone]  RESPONSIBLE PARTY:    Contact Information     Name Relation Home Work [place] Other   [phone]       I met face to face with patient in [place] [facility] [place] facility. I was assisted by [facility] staff Olga Millers. Palliative Care was asked to follow this patient by consultation request of  [facility] to address advance care planning and complex medical decision making. This is a follow up visit.                                   ASSESSMENT AND PLAN / RECOMMENDATIONS:   Advance Care Planning/Goals of Care: Goals include to maximize quality of life and symptom management. Patient/health care surrogate gave his/her permission to discuss.Our advance care planning conversation included a discussion about:    The value and importance of advance care planning  Identification of a healthcare agent - current agent has resigned, and she is pending guardianship from  [place]:  FULL  Symptom Management/Plan:  I assessed patient today Face to Face for DME needs.  Mobility Needs: Patient has a mobility limitation that impairs ability to do one or more MRADLs in a her ALF. He immobility is due to weakness, deconditioning in advanced dementia disease process.  She  cannot use cane crutches or walker to solve this mobility issue. Without the wheel chair she would be bed bound, unable to mobilize to meals.  Patient cannot self propel but has a caregiver who will provide assistance. She needs standard wheel chair for ADLs and for safety.   Pt cannot walk at all. She is transferred into a wheel chair by staff. She can use walker to pivot transfer  into wheel chair.   Nutrition: Patient is eating well, now has gained some weight, weight is 182 lbs and height is 64". Body mass index is 31.24 kg/m.  Follow up Palliative Care Visit: Palliative care will continue to follow for complex medical decision making, advance care planning, and clarification of goals. Return 6 weeks or prn.  This visit was coded based on medical decision making (MDM).  PPS: 30%  HOSPICE ELIGIBILITY/DIAGNOSIS: no  Chief Complaint: immobility  HISTORY OF PRESENT ILLNESS:  Carol Curtis is a 86 y.o. year old female  with advanced dementia, immobility, fall risk. FAST score is 7 C. She is seen today for DME assessment and overall assessment of nutrition and disease progression.  History obtained from review of EMR, discussion with primary team, and interview with family, facility staff/caregiver and/or [name].  I reviewed available labs, medications, imaging, studies and related documents from the EMR.  Records reviewed and summarized above.   ROS [place]  General: NAD ENMT: denies dysphagia Cardiovascular: denies chest pain, denies DOE Pulmonary: denies cough, denies increased SOB Abdomen: endorses good appetite, denies constipation, endorses incontinence of  bowel GU: denies dysuria, endorses incontinence of urine MSK:  endorses  increased weakness,  no falls reported Skin: denies rashes or wounds Neurological: denies pain, denies insomnia Psych: Endorses positive mood Heme/lymph/immuno: denies bruises, abnormal bleeding  Physical Exam: Current and past weights: Today's Vitals   08/04/21 1222  Weight: 182 lb (82.6 kg)  Height: 5' 4"  (1.626 m)   Body mass index is 31.24 kg/m.  Constitutional: NAD General: frail appearing, WNWD EYES: anicteric sclera, lids intact, no discharge  Pulmonary: no increased work of breathing, no cough, room air MSK: non ambulatory Skin: warm and dry, no rashes or wounds on visible skin Neuro:    ++ cognitive impairment Psych: non-anxious affect, A and O x 1 Hem/lymph/immuno: no widespread bruising   Thank you for the opportunity to participate in the care of [name].  The palliative care team will continue to follow. Please call our office at [phone] if we can be of additional assistance.   Jason Coop, NP DNP, AGPCNP-BC  COVID-19 PATIENT SCREENING TOOL Asked and negative response unless otherwise noted:   Have you had symptoms of covid, tested positive or been in contact with someone with symptoms/positive test in the past 5-10 days?

## 2021-08-05 ENCOUNTER — Telehealth: Payer: Self-pay

## 2021-08-05 NOTE — Telephone Encounter (Signed)
835 am.  Follow up phone call made to [employer].  Spoke with Minna Merritts regarding F2F documentation sent yesterday for patient to have a wheelchair.  No further documentation is needed at this time.  [facility] has reached out to [facility] and they are waiting for a call back.

## 2021-08-05 NOTE — Telephone Encounter (Signed)
1025 am.  Contacted [employer] to advise that Water quality scientist for [facility] should be contacted regarding DME.  Contact name and number provided to [facility].  She will update the processing team of new contact.

## 2021-09-09 ENCOUNTER — Other Ambulatory Visit: Payer: Medicare PPO | Admitting: Primary Care

## 2021-09-09 ENCOUNTER — Other Ambulatory Visit: Payer: Self-pay

## 2021-09-09 DIAGNOSIS — R531 Weakness: Secondary | ICD-10-CM

## 2021-09-09 DIAGNOSIS — M7989 Other specified soft tissue disorders: Secondary | ICD-10-CM

## 2021-09-09 DIAGNOSIS — F0280 Dementia in other diseases classified elsewhere without behavioral disturbance: Secondary | ICD-10-CM

## 2021-09-09 DIAGNOSIS — R609 Edema, unspecified: Secondary | ICD-10-CM

## 2021-09-09 NOTE — Progress Notes (Signed)
? ? ?Manufacturing engineer ?Community Palliative Care Consult Note ?Telephone: [phone]  ?Fax: [phone]  ? ? ?Date of encounter: 09/09/21 ?2:58 PM ?PATIENT NAME: Carol Curtis ?[facility] ?[address] ?[place] [address]   ?[phone] (home) [phone] (work) ?DOB: 11/29/1935 ?MRN: [id number] ?PRIMARY CARE PROVIDER:    ?[facility],  ?[address] ? [place] [address] ?[phone] ? ?REFERRING PROVIDER:   ?[facility] ?[address] ?[place],  [place] [address] ?[phone] ? ?RESPONSIBLE PARTY:    ?Contact Information   ? ? Name Relation Home Work Mobile  ? Marylene Buerger Other   [phone]  ? ?  ? ? ?I met face to face with patient in [place] [facility] facility. Palliative Care was asked to follow this patient by consultation request of  [facility]* to address advance care planning and complex medical decision making. This is a follow up visit. ? ?                                 ASSESSMENT AND PLAN / RECOMMENDATIONS:  ? ?Advance Care Planning/Goals of Care: Goals include to maximize quality of life and symptom management.   ?Identification of a healthcare agent - DSS guardianship pending ?CODE STATUS: DNR ? ?Symptom Management/Plan: ? ?LE edema: increasing to 3-4+ now. Recommend compression bil. This is new for pt.  Last visit had no edema. BP reported 142/90 on  08/26/21, staff states they don't check but monthly. Also have had scale out of order due to renovation. Appears to be new edema, no respiratory distress. Staff reports she takes meds as ordered. 171 lb in Jan, nothing more recent.  Recommend addressing with diuretic or compression, and weighing weekly. ? ?Nutrition: eating well, cued by staff. ? ?Dementia: Able to smile and acknowledge my interaction. Has pulled off clothing which is new behavior. I helped her back on with her shirt and she did not remove again. ? ?Dermatitis: Appears resolved. Residents  have been treated for skin infections.  ? ?Follow up Palliative Care Visit: Palliative care will continue to follow for complex medical decision making, advance care planning, and clarification of goals. Return 4-6 weeks or prn. ? ?This visit was coded based on medical decision making (MDM). ? ? ?PPS: 40% ? ?HOSPICE ELIGIBILITY/DIAGNOSIS: TBD ? ?Chief Complaint: dementia ? ?HISTORY OF PRESENT ILLNESS:  Carol Curtis is a 86 y.o. year old female  with dementia, edema . Patient seen today to review palliative care needs to include medical decision making and advance care planning as appropriate.  ? ?History obtained from review of EMR, discussion with primary team, and interview with family, facility staff/caregiver and/or [name].  ?I reviewed available labs, medications, imaging, studies and related documents from the EMR.  Records reviewed and summarized above.  ? ?ROS/ staff ? ? ?General: NAD ?ENMT: denies dysphagia ?Pulmonary: denies cough, denies increased SOB ?Abdomen: endorses good appetite, denies constipation, endorses incontinence of bowel ?GU: denies dysuria, endorses incontinence of urine ?MSK:  denies  increased weakness,  no falls reported ?Skin: denies rashes or wounds ?Neurological: denies pain, denies insomnia ?Psych: Endorses positive mood ? ?Physical Exam: ?Current and past weights:171 lbs, no weight x 2 mos. Need for edema monitoring ?Constitutional: NAD ?General: frail appearing ?EYES: anicteric sclera, lids intact, no discharge  ?ENMT: intact hearing, oral mucous membranes moist ?CV: RRR, 3-4 + L>R  LE edema ?Pulmonary: no increased  work of breathing, no cough, room air ?Abdomen: intake 75-100%, no ascites ?MSK: no sarcopenia, moves all extremities,  non ambulatory ?Skin: warm and dry, no rashes or wounds on visible skin ?Neuro:  +generalized weakness,  severe  cognitive impairment, non-anxious affect ? ?Thank you for the opportunity to participate in the care of [name].  The  palliative care team will continue to follow. Please call our office at [phone] if we can be of additional assistance.  ? ?Jason Coop, NP DNP, AGPCNP-BC ? ?COVID-19 PATIENT SCREENING TOOL ?Asked and negative response unless otherwise noted:  ? ?Have you had symptoms of covid, tested positive or been in contact with someone with symptoms/positive test in the past 5-10 days?  ? ?

## 2021-09-29 ENCOUNTER — Telehealth: Payer: Self-pay | Admitting: Primary Care

## 2021-09-29 NOTE — Telephone Encounter (Signed)
Received call from Dr Candiss Norse. I was not able to reach anyone at the number given for call back, tried several times. Pt is PC patient and [name] is no longer POA. PT is now under care of [facility] APS for guardianship. Email message to Dr. Candiss Norse attempted, and our liaison also notified. Ms Galen Manila at [facility] notified the hospital needs info on [place]. Ms Jannifer Franklin states this case  is under [facility] APS at this time. ?

## 2021-11-05 ENCOUNTER — Other Ambulatory Visit: Payer: Medicare PPO | Admitting: Primary Care

## 2021-11-05 DIAGNOSIS — M7989 Other specified soft tissue disorders: Secondary | ICD-10-CM

## 2021-11-05 DIAGNOSIS — R531 Weakness: Secondary | ICD-10-CM

## 2021-11-05 DIAGNOSIS — F0280 Dementia in other diseases classified elsewhere without behavioral disturbance: Secondary | ICD-10-CM

## 2021-11-05 DIAGNOSIS — Z515 Encounter for palliative care: Secondary | ICD-10-CM

## 2021-11-05 NOTE — Progress Notes (Signed)
[facility] Consult Note Telephone: [phone]  Fax: [phone]    Date of encounter: 11/05/21 9:18 AM PATIENT NAME: Carol Curtis   [phone] (home) [phone] (work) DOB: 11-11-1935 MRN: [id number] PRIMARY CARE PROVIDER:    [facility],  [place] [address] [phone]  REFERRING PROVIDER:   [facility] [place] [address] [place],  [place] [address] [phone]  RESPONSIBLE PARTY:    Contact Information     Name Relation Home Work [place] Other   [phone]        I met face to face with patient in [facility]. Palliative Care was asked to follow this patient by consultation request of  [facility] to address advance care planning and complex medical decision making. This is a follow up visit.                                   ASSESSMENT AND PLAN / RECOMMENDATIONS:   Advance Care Planning/Goals of Care: Goals include to maximize quality of life and symptom management.  Identification of a healthcare agent - TBD CODE STATUS: FULL  Symptom Management/Plan: I met with patient and her assisted living. This is my first check in following her hospital stay. Today she has  significant edema. According to her medication list, she is getting 20 mg of  Lasix every other day. She today has crackles in wheezes in her lungs. I would recommend increasing Lasix daily and obtaining this order plus any monitoring orders from her primary.   She does not show any visible signs of dyspnea or respiratory distress but this edema appears to be present where 3 to 4 months ago she did not have any.   I also am interested to know if guardianship since her previous guardian resigned. On her hospital stay I was contacted by [facility] regarding her poa.    Follow up Palliative Care Visit:  Palliative care will continue to follow for complex medical decision making, advance care planning, and clarification of goals. Return 8 weeks or prn.  This visit was coded based on medical decision making (MDM).  PPS: 30%  HOSPICE ELIGIBILITY/DIAGNOSIS: TBD  Chief Complaint: dementia  HISTORY OF PRESENT ILLNESS:  Carol Curtis is a 86 y.o. year old female  with recent hospital stay with return to ALF. At her baseline for dementia and function but appears to have increased edema. Patient seen today to review palliative care needs to include medical decision making and advance care planning as appropriate.   History obtained from review of EMR, discussion with primary team, and interview with family, facility staff/caregiver and/or Carol Curtis.  I reviewed available labs, medications, imaging, studies and related documents from the EMR.  Records reviewed and summarized above.   ROS/staff   General: NAD ENMT: denies dysphagia Pulmonary: denies cough, denies increased SOB at rest Abdomen: endorses good appetite, denies constipation, endorses incontinence of bowel GU: denies dysuria, endorses incontinence of urine MSK:  denies  increased weakness,  no falls reported Skin: denies rashes or wounds Neurological: denies pain, denies insomnia Psych: Endorses positive mood  Physical Exam: Current and past weights:unavailable Constitutional: NAD General: frail appearing EYES: anicteric sclera, lids intact, no discharge  ENMT: intact hearing, oral mucous membranes moist, CV: S1S2, RRR, bil 2-3+LE edema Pulmonary:  wheezes and rales  in all fields,  no increased work of breathing, no cough, room air Abdomen: intake 90%, normo-active BS + 4 quadrants, soft and non tender, no ascites MSK: moderate sarcopenia, moves all extremities, non ambulatory Skin: warm and dry, no rashes or wounds on visible skin Neuro:  + generalized weakness,  severe cognitive impairment, non-anxious  affect   Thank you for the opportunity to participate in the care of Carol Curtis.  The palliative care team will continue to follow. Please call our office at [phone] if we can be of additional assistance.   Jason Coop, NP DNP, AGPCNP-BC  COVID-19 PATIENT SCREENING TOOL Asked and negative response unless otherwise noted:   Have you had symptoms of covid, tested positive or been in contact with someone with symptoms/positive test in the past 5-10 days?

## 2022-01-18 ENCOUNTER — Other Ambulatory Visit: Payer: Medicare PPO | Admitting: Primary Care

## 2022-01-18 DIAGNOSIS — R609 Edema, unspecified: Secondary | ICD-10-CM

## 2022-01-18 DIAGNOSIS — F0280 Dementia in other diseases classified elsewhere without behavioral disturbance: Secondary | ICD-10-CM

## 2022-01-18 NOTE — Progress Notes (Signed)
[facility] Consult Note Telephone: [phone]  Fax: [phone]    Date of encounter: 01/18/22 4:34 PM PATIENT NAME: Carol Curtis [address] [place] [address]   [phone] (home) [phone] (work) DOB: [birth year] MRN: [id number] PRIMARY CARE PROVIDER:    [facility],  [place] [address] [phone]  REFERRING PROVIDER:   [facility] [place] [address] [place],  [place] [address] [phone]  RESPONSIBLE PARTY:    Contact Information     Name Relation Home Work [place] Other   [phone]      Decision maker may now be DSS, facility is checking on it.  I met face to face with patient [facility] facility. Palliative Care was asked to follow this patient by consultation request of  [facility] to address advance care planning and complex medical decision making. This is a follow up visit.                                   ASSESSMENT AND PLAN / RECOMMENDATIONS:   Advance Care Planning/Goals of Care: Goals include to maximize quality of life and symptom management. Patient/health care surrogate gave his/her permission to discuss.Our advance care planning conversation included a discussion about:    Discussed POA with ALF staff- need to determine, so I will try [facility] DSS Identification of a healthcare agent - unavailable  CODE STATUS: Not available  Symptom Management/Plan:  Patient a and o x 1, smiling and interactive, able to speak 'thank you' but humming and non verbal, Non ambulatory. Staff endorses she is eating well, content. Her LE edema is improved. Her BG is improved. She participates in some activities and meals. I will see if our SW can locate DSS worker for review of ACP.  Follow up Palliative Care Visit: Palliative care will continue to follow for complex medical  decision making, advance care planning, and clarification of goals. Return 12 weeks or prn.  This visit was coded based on medical decision making (MDM).  PPS: 40%  HOSPICE ELIGIBILITY/DIAGNOSIS: no  Chief Complaint: memory loss, debility  HISTORY OF PRESENT ILLNESS:  Carol Curtis is a 86 y.o. year old female  with advanced dementia, CHF, DM . Patient seen today to review palliative care needs to include medical decision making and advance care planning as appropriate.   History obtained from review of EMR, discussion with primary team, and interview with family, facility staff/caregiver and/or Carol Curtis.  I reviewed available labs, medications, imaging, studies and related documents from the EMR.  Records reviewed and summarized above.   ROS/staff General: NAD ENMT: denies dysphagia Pulmonary: denies cough, denies increased SOB Abdomen: endorses good appetite, denies constipation, endorses incontinence of bowel GU: denies dysuria, endorses incontinence of urine MSK:  denies  increased weakness,  no falls reported Skin: denies rashes or wounds Neurological: denies pain, denies insomnia Psych: Endorses positive mood, advanced dementia  Physical Exam: Current and past weights:unavailable Constitutional: NAD General: frail appearing,  EYES: anicteric sclera, lids intact, no discharge  ENMT: intact hearing, oral mucous membranes moist, edentulous CV: S1S2, RRR,  bil 2-3 +   le edema Pulmonary: LCTA, no increased work of breathing, no cough, room air Abdomen: intake 100%,  no ascites, BG 240's,  MSK: + sarcopenia, moves all extremities, on ambulatory Skin: warm and  dry, no rashes or wounds on visible skin Neuro:  no  new generalized weakness,  +cognitive impairment, non-anxious affect, R hand tremor   Thank you for the opportunity to participate in the care of Carol Curtis.  The palliative care team will continue to follow. Please call our office at [phone] if we can be of  additional assistance.   Jason Coop, NP DNP, AGPCNP-BC  COVID-19 PATIENT SCREENING TOOL Asked and negative response unless otherwise noted:   Have you had symptoms of covid, tested positive or been in contact with someone with symptoms/positive test in the past 5-10 days?  byyyt

## 2022-01-22 ENCOUNTER — Telehealth: Payer: Self-pay

## 2022-01-22 NOTE — Telephone Encounter (Signed)
PC SW outreached Carol Curtis [phone] with [facility] , who confirmed that he is the interim guardian for patient until a permanent guardian is assigned.

## 2022-01-28 ENCOUNTER — Other Ambulatory Visit: Payer: Medicare PPO | Admitting: Primary Care
